# Patient Record
Sex: Female | Born: 1955 | Race: White | Hispanic: No | Marital: Married | Smoking: Former smoker
Health system: Southern US, Community
[De-identification: ages and names within clinical notes are randomized; demographics above are authoritative.]

## PROBLEM LIST (undated history)

## (undated) DIAGNOSIS — K859 Acute pancreatitis without necrosis or infection, unspecified: Secondary | ICD-10-CM

## (undated) DIAGNOSIS — E785 Hyperlipidemia, unspecified: Secondary | ICD-10-CM

## (undated) DIAGNOSIS — Z8719 Personal history of other diseases of the digestive system: Secondary | ICD-10-CM

## (undated) DIAGNOSIS — E039 Hypothyroidism, unspecified: Secondary | ICD-10-CM

## (undated) DIAGNOSIS — K219 Gastro-esophageal reflux disease without esophagitis: Secondary | ICD-10-CM

## (undated) DIAGNOSIS — E079 Disorder of thyroid, unspecified: Secondary | ICD-10-CM

## (undated) DIAGNOSIS — E0590 Thyrotoxicosis, unspecified without thyrotoxic crisis or storm: Secondary | ICD-10-CM

## (undated) DIAGNOSIS — R17 Unspecified jaundice: Secondary | ICD-10-CM

## (undated) HISTORY — PX: TUBAL LIGATION: SHX77

## (undated) HISTORY — PX: ABDOMINAL HYSTERECTOMY: SHX81

## (undated) HISTORY — DX: Hyperlipidemia, unspecified: E78.5

## (undated) HISTORY — PX: APPENDECTOMY: SHX54

---

## 1999-04-26 ENCOUNTER — Encounter: Admission: RE | Admit: 1999-04-26 | Discharge: 1999-04-26 | Payer: Self-pay | Admitting: Family Medicine

## 1999-04-26 ENCOUNTER — Encounter: Payer: Self-pay | Admitting: Family Medicine

## 1999-11-15 ENCOUNTER — Encounter: Payer: Self-pay | Admitting: Family Medicine

## 1999-11-15 ENCOUNTER — Encounter: Admission: RE | Admit: 1999-11-15 | Discharge: 1999-11-15 | Payer: Self-pay | Admitting: Family Medicine

## 1999-12-10 ENCOUNTER — Encounter: Payer: Self-pay | Admitting: Family Medicine

## 1999-12-10 ENCOUNTER — Encounter: Admission: RE | Admit: 1999-12-10 | Discharge: 1999-12-10 | Payer: Self-pay | Admitting: Family Medicine

## 1999-12-26 ENCOUNTER — Encounter: Admission: RE | Admit: 1999-12-26 | Discharge: 1999-12-26 | Payer: Self-pay | Admitting: Family Medicine

## 1999-12-26 ENCOUNTER — Encounter: Payer: Self-pay | Admitting: Family Medicine

## 2000-05-13 ENCOUNTER — Other Ambulatory Visit: Admission: RE | Admit: 2000-05-13 | Discharge: 2000-05-13 | Payer: Self-pay | Admitting: Obstetrics and Gynecology

## 2000-05-19 ENCOUNTER — Ambulatory Visit (HOSPITAL_COMMUNITY): Admission: RE | Admit: 2000-05-19 | Discharge: 2000-05-19 | Payer: Self-pay | Admitting: Gastroenterology

## 2000-05-19 ENCOUNTER — Encounter (INDEPENDENT_AMBULATORY_CARE_PROVIDER_SITE_OTHER): Payer: Self-pay | Admitting: Specialist

## 2000-07-21 ENCOUNTER — Encounter: Payer: Self-pay | Admitting: Gastroenterology

## 2000-07-21 ENCOUNTER — Ambulatory Visit (HOSPITAL_COMMUNITY): Admission: RE | Admit: 2000-07-21 | Discharge: 2000-07-21 | Payer: Self-pay | Admitting: Gastroenterology

## 2000-07-24 ENCOUNTER — Encounter: Admission: RE | Admit: 2000-07-24 | Discharge: 2000-07-24 | Payer: Self-pay | Admitting: Gastroenterology

## 2000-07-24 ENCOUNTER — Encounter: Payer: Self-pay | Admitting: Gastroenterology

## 2000-08-31 ENCOUNTER — Encounter: Admission: RE | Admit: 2000-08-31 | Discharge: 2000-08-31 | Payer: Self-pay | Admitting: Internal Medicine

## 2000-08-31 ENCOUNTER — Encounter: Admission: RE | Admit: 2000-08-31 | Discharge: 2000-08-31 | Payer: Self-pay | Admitting: Surgery

## 2000-08-31 ENCOUNTER — Encounter: Payer: Self-pay | Admitting: Internal Medicine

## 2000-08-31 ENCOUNTER — Encounter: Payer: Self-pay | Admitting: Surgery

## 2000-09-23 ENCOUNTER — Encounter: Payer: Self-pay | Admitting: Surgery

## 2000-09-23 ENCOUNTER — Encounter: Admission: RE | Admit: 2000-09-23 | Discharge: 2000-09-23 | Payer: Self-pay | Admitting: Surgery

## 2000-10-23 ENCOUNTER — Encounter: Admission: RE | Admit: 2000-10-23 | Discharge: 2000-10-23 | Payer: Self-pay | Admitting: Surgery

## 2000-10-23 ENCOUNTER — Encounter: Payer: Self-pay | Admitting: Surgery

## 2001-08-30 ENCOUNTER — Encounter: Payer: Self-pay | Admitting: Family Medicine

## 2001-08-30 ENCOUNTER — Encounter: Admission: RE | Admit: 2001-08-30 | Discharge: 2001-08-30 | Payer: Self-pay | Admitting: Family Medicine

## 2001-10-19 ENCOUNTER — Encounter: Payer: Self-pay | Admitting: Emergency Medicine

## 2001-10-19 ENCOUNTER — Emergency Department (HOSPITAL_COMMUNITY): Admission: EM | Admit: 2001-10-19 | Discharge: 2001-10-19 | Payer: Self-pay | Admitting: Emergency Medicine

## 2004-06-28 ENCOUNTER — Emergency Department (HOSPITAL_COMMUNITY): Admission: EM | Admit: 2004-06-28 | Discharge: 2004-06-29 | Payer: Self-pay | Admitting: Emergency Medicine

## 2005-04-15 ENCOUNTER — Ambulatory Visit (HOSPITAL_COMMUNITY): Admission: RE | Admit: 2005-04-15 | Discharge: 2005-04-15 | Payer: Self-pay | Admitting: Sports Medicine

## 2008-05-06 ENCOUNTER — Emergency Department (HOSPITAL_COMMUNITY): Admission: EM | Admit: 2008-05-06 | Discharge: 2008-05-07 | Payer: Self-pay | Admitting: Emergency Medicine

## 2010-07-02 LAB — LIPASE, BLOOD: Lipase: 38 U/L (ref 11–59)

## 2010-07-02 LAB — COMPREHENSIVE METABOLIC PANEL
ALT: 42 U/L — ABNORMAL HIGH (ref 0–35)
AST: 35 U/L (ref 0–37)
Albumin: 3.9 g/dL (ref 3.5–5.2)
Alkaline Phosphatase: 62 U/L (ref 39–117)
CO2: 23 mEq/L (ref 19–32)
Chloride: 103 mEq/L (ref 96–112)
Creatinine, Ser: 0.7 mg/dL (ref 0.4–1.2)
GFR calc Af Amer: >60 mL/min (ref >60)
GFR calc non Af Amer: >60 mL/min (ref >60)
Potassium: 3.8 mEq/L (ref 3.5–5.1)
Sodium: 136 mEq/L (ref 135–145)
Total Bilirubin: 0.7 mg/dL (ref 0.3–1.2)

## 2010-07-02 LAB — DIFFERENTIAL
Basophils Absolute: 0.1 10*3/uL (ref 0.0–0.1)
Basophils Relative: 1 % (ref 0–1)
Eosinophils Absolute: 0.1 10*3/uL (ref 0.0–0.7)
Eosinophils Relative: 1 % (ref 0–5)
Monocytes Absolute: 0.4 10*3/uL (ref 0.1–1.0)

## 2010-07-02 LAB — CBC
MCV: 93.7 fL (ref 78.0–100.0)
Platelets: 206 10*3/uL (ref 150–400)
RBC: 4.14 MIL/uL (ref 3.87–5.11)
WBC: 6.9 10*3/uL (ref 4.0–10.5)

## 2010-07-02 LAB — HEMOCCULT GUIAC POC 1CARD (OFFICE): Fecal Occult Bld: NEGATIVE

## 2010-08-02 NOTE — Procedures (Signed)
[place]  Patient:    Sheryl Turner, Sheryl Turner                        MRN: [id number] Proc. Date: 05/19/00 Attending:  Petra Kuba, M.D. CC:         Desma Maxim, M.D.   Procedure Report  PROCEDURE:  Esophagogastroduodenoscopy with biopsy.  INDICATIONS:  Atypical chest pain with some help from Protonix and Librax. Consent was signed after risks, benefits, methods and options were thoroughly discussed in the office.  MEDICATIONS:  Demerol 50, Versed 5.  PROCEDURE:  The video endoscope was inserted by direct vision.  The esophagus was normal, except for a small hiatal hernia.  No obvious signs of Barretts or significant esophagitis was seen.  Scope was inserted into the stomach and advanced to the antrum, where some minimal antritis was seen.  Advanced through a normal pylorus into a normal duodenal bulb, and around the C-loop to a normal second portion of the duodenum.  Scope was withdrawn back to the bulb and a good look there ruled out abnormalities in that location.  Scope was withdrawn back to stomach and retroflexed.  High in the cardia, the hiatal hernia was confirmed.  The fundus, angularis, lesser and greater curves were normal on retroflexed visualization.  Straight visualization of the stomach was normal as well.  Scope was then slowly withdrawn back to 20 cm, which again confirmed the normal esophagus.  The scope was advanced to the antrum one more time.  Two biopsies of the antrum and two of the proximal stomach were obtained to rule out Helicobacter.  Air was suctioned.  On slow withdrawal, a few scattered distal esophageal biopsies were obtained to confirm reflux and rule out any other microscopic problems.  The scope was further withdrawn.  Patient tolerated the procedure well, there was no obvious immediate complication.  ENDOSCOPIC DIAGNOSES: 1. Small hiatal hernia without significant reflux or Barretts, status post    distal  esophageal biopsies. 2. Minimal antritis, status biopsy. 3. Otherwise within normal limits esophagogastroduodenoscopy with proximal    stomach biopsies as well to rule out Helicobacter.  PLAN:  Will increase her pump inhibitors to twice a day.  Talk to patient in a week about the pathology.  Call me p.r.n.  Otherwise follow up in six weeks to recheck symptoms and decide any other work-up and plans. DD:  05/19/00 TD:  05/19/00 Job: [id number]

## 2010-09-10 ENCOUNTER — Emergency Department (HOSPITAL_COMMUNITY): Payer: No Typology Code available for payment source

## 2010-09-10 ENCOUNTER — Emergency Department (HOSPITAL_COMMUNITY)
Admission: EM | Admit: 2010-09-10 | Discharge: 2010-09-10 | Disposition: A | Discharge status: Home / Self Care | Payer: No Typology Code available for payment source | Attending: Emergency Medicine | Admitting: Emergency Medicine

## 2010-09-10 DIAGNOSIS — Y9229 Other specified public building as the place of occurrence of the external cause: Secondary | ICD-10-CM | POA: Insufficient documentation

## 2010-09-10 DIAGNOSIS — M79609 Pain in unspecified limb: Secondary | ICD-10-CM | POA: Insufficient documentation

## 2010-09-10 DIAGNOSIS — S8010XA Contusion of unspecified lower leg, initial encounter: Secondary | ICD-10-CM | POA: Insufficient documentation

## 2010-09-10 DIAGNOSIS — W208XXA Other cause of strike by thrown, projected or falling object, initial encounter: Secondary | ICD-10-CM | POA: Insufficient documentation

## 2012-09-12 ENCOUNTER — Encounter (HOSPITAL_COMMUNITY): Payer: Self-pay | Admitting: Emergency Medicine

## 2012-09-12 ENCOUNTER — Emergency Department (HOSPITAL_COMMUNITY): Payer: Self-pay

## 2012-09-12 ENCOUNTER — Emergency Department (HOSPITAL_COMMUNITY)
Admission: EM | Admit: 2012-09-12 | Discharge: 2012-09-12 | Disposition: A | Discharge status: Home / Self Care | Payer: Self-pay | Attending: Emergency Medicine | Admitting: Emergency Medicine

## 2012-09-12 DIAGNOSIS — K859 Acute pancreatitis without necrosis or infection, unspecified: Secondary | ICD-10-CM | POA: Insufficient documentation

## 2012-09-12 DIAGNOSIS — R110 Nausea: Secondary | ICD-10-CM | POA: Insufficient documentation

## 2012-09-12 LAB — COMPREHENSIVE METABOLIC PANEL WITH GFR
ALT: 49 U/L — ABNORMAL HIGH (ref 0–35)
Alkaline Phosphatase: 68 U/L (ref 39–117)
CO2: 24 meq/L (ref 19–32)
GFR calc Af Amer: 79 mL/min — ABNORMAL LOW (ref >90)
Glucose, Bld: 97 mg/dL (ref 70–99)
Potassium: 3.5 meq/L (ref 3.5–5.1)
Sodium: 137 meq/L (ref 135–145)
Total Protein: 7.4 g/dL (ref 6.0–8.3)

## 2012-09-12 LAB — COMPREHENSIVE METABOLIC PANEL
AST: 62 U/L — ABNORMAL HIGH (ref 0–37)
Albumin: 4.2 g/dL (ref 3.5–5.2)
BUN: 16 mg/dL (ref 6–23)
Calcium: 9.4 mg/dL (ref 8.4–10.5)
Chloride: 100 mEq/L (ref 96–112)
Creatinine, Ser: 0.93 mg/dL (ref 0.50–1.10)
GFR calc non Af Amer: 68 mL/min — ABNORMAL LOW (ref >90)
Total Bilirubin: 0.4 mg/dL (ref 0.3–1.2)

## 2012-09-12 LAB — CBC WITH DIFFERENTIAL/PLATELET
Basophils Absolute: 0.1 K/uL (ref 0.0–0.1)
Basophils Relative: 1 % (ref 0–1)
Eosinophils Absolute: 0.1 K/uL (ref 0.0–0.7)
Eosinophils Relative: 1 % (ref 0–5)
HCT: 40.3 % (ref 36.0–46.0)
Hemoglobin: 13.5 g/dL (ref 12.0–15.0)
Lymphocytes Relative: 37 % (ref 12–46)
Lymphs Abs: 2.5 10*3/uL (ref 0.7–4.0)
MCH: 31 pg (ref 26.0–34.0)
MCHC: 33.5 g/dL (ref 30.0–36.0)
MCV: 92.6 fL (ref 78.0–100.0)
Monocytes Absolute: 0.4 K/uL (ref 0.1–1.0)
Monocytes Relative: 6 % (ref 3–12)
Neutro Abs: 3.6 K/uL (ref 1.7–7.7)
Neutrophils Relative %: 55 % (ref 43–77)
Platelets: 234 10*3/uL (ref 150–400)
RBC: 4.35 MIL/uL (ref 3.87–5.11)
RDW: 13.8 % (ref 11.5–15.5)
WBC: 6.7 10*3/uL (ref 4.0–10.5)

## 2012-09-12 LAB — LIPASE, BLOOD: Lipase: 79 U/L — ABNORMAL HIGH (ref 11–59)

## 2012-09-12 LAB — URINALYSIS, ROUTINE W REFLEX MICROSCOPIC
Glucose, UA: NEGATIVE mg/dL
Hgb urine dipstick: NEGATIVE
Leukocytes, UA: NEGATIVE
pH: 7.5 (ref 5.0–8.0)

## 2012-09-12 LAB — POCT I-STAT TROPONIN I: Troponin i, poc: 0 ng/mL (ref 0.00–0.08)

## 2012-09-12 LAB — TROPONIN I: Troponin I: <0.3 ng/mL (ref <0.30)

## 2012-09-12 MED ORDER — ONDANSETRON HCL 4 MG/2ML IJ SOLN
4.0000 mg | Freq: Once | INTRAMUSCULAR | Status: AC
  Administered 2012-09-12: 4 mg via INTRAVENOUS
  Filled 2012-09-12: qty 2

## 2012-09-12 MED ORDER — ONDANSETRON HCL 4 MG PO TABS: 4.0000 mg | ORAL_TABLET | Freq: Four times a day (QID) | ORAL | Status: DC

## 2012-09-12 MED ORDER — SODIUM CHLORIDE 0.9 % IV BOLUS (SEPSIS)
1000.0000 mL | Freq: Once | INTRAVENOUS | Status: AC
  Administered 2012-09-12: 1000 mL via INTRAVENOUS

## 2012-09-12 MED ORDER — MORPHINE SULFATE 4 MG/ML IJ SOLN
4.0000 mg | Freq: Once | INTRAMUSCULAR | Status: AC
  Administered 2012-09-12: 4 mg via INTRAVENOUS
  Filled 2012-09-12: qty 1

## 2012-09-12 MED ORDER — TRAMADOL HCL 50 MG PO TABS: 50.0000 mg | ORAL_TABLET | Freq: Four times a day (QID) | ORAL | Status: DC | PRN

## 2012-09-12 NOTE — ED Provider Notes (Signed)
History    CSN: [id number] Arrival date & time 09/12/12  1607  First MD Initiated Contact with Patient 09/12/12 1625     Chief Complaint  Patient presents with  . Abdominal Pain   (Consider location/radiation/quality/duration/timing/severity/associated sxs/prior Treatment) HPI  57 year old female with no significant past medical history presents complaining of epigastric abdominal pain. Patient reports she was leaving church today and while driving in the car she experiencing an acute onset of sharp achy pain to the epigastric region which radiates to her back. Pain was initially a 9/10 but has been waxing and waning and currently is a 5/10. Nothing seems to make the pain worse but taking deep breath seems to make the pain better.she endorses nausea without vomiting or diarrhea. She tries Tums and drank cola without relief.she tries belching without relief. She denies fever, headache, lightheadedness, dizziness, chest pain, shortness of breath, dysuria, hematuria, hematochezia, melena, or rash.  patient is nonsmoker without any significant cardiac history. no history of alcohol abuse or history of diabetes.  Patient has prior surgical history include cesarean section, appendectomy, and abdominal hysterectomy. last bowel movement was normal. She ate her normal food today, country fried steak.    History reviewed. No pertinent past medical history. Past Surgical History  Procedure Laterality Date  . Cesarean section    . Appendectomy    . Abdominal hysterectomy     No family history on file. History  Substance Use Topics  . Smoking status: Never Smoker   . Smokeless tobacco: Not on file  . Alcohol Use: No   OB History   Grav Para Term Preterm Abortions TAB SAB Ect Mult Living                 Review of Systems  All other systems reviewed and are negative.    Allergies  Darvocet  Home Medications  No current outpatient prescriptions on file. BP 158/77  Pulse 71  Temp(Src)  97.3 F (36.3 C) (Oral)  Resp 18  SpO2 98% Physical Exam  Nursing note and vitals reviewed. Constitutional: She is oriented to person, place, and time. She appears well-developed and well-nourished. No distress.  Awake, alert, nontoxic appearance. Moderately obese.  HENT:  Head: Atraumatic.  Mouth/Throat: Oropharynx is clear and moist.  Eyes: Conjunctivae are normal. Right eye exhibits no discharge. Left eye exhibits no discharge.  Neck: Neck supple.  Cardiovascular: Normal rate, regular rhythm and intact distal pulses.   Pulmonary/Chest: Effort normal. No respiratory distress. She exhibits no tenderness.  Abdominal: Soft. Bowel sounds are normal. There is tenderness (Tenderness to epigastric and left upper quadrant on palpation without guarding or rebound tenderness. Mild tenderness to right upper quadrant with no Murphy sign. No McBurney's point. No hernia noted. No overlying skin changes.). There is no rebound.  Genitourinary:  No CVA tenderness  Musculoskeletal: She exhibits no edema and no tenderness.  ROM appears intact, no obvious focal weakness  Neurological: She is alert and oriented to person, place, and time.  Mental status and motor strength appears intact  Skin: No rash noted.  Psychiatric: She has a normal mood and affect.    ED Course  Procedures (including critical care time)    Date: 09/12/2012  Rate: 63  Rhythm: normal sinus rhythm  QRS Axis: normal  Intervals: normal  ST/T Wave abnormalities: normal  Conduction Disutrbances: none  Narrative Interpretation:   Old EKG Reviewed: No significant changes noted  4:42 PM Patient presents with epigastric and left upper quadrant abdominal pain.  Abdomen is tender but nonsurgical. She is afebrile with stable normal vital signs. Workup initiated.  Will also obtain abd [place] to r/o biliary disease.  I have low suspicion for aortic dissection.  Care discussed with attending.    7:39 PM Patient has elevated lipase at 79.  Mildly elevated liver enzyme with AST 62, ALT 49 normal alkaline phosphatase. She has normal WBC. Her urine is unremarkable.a chest x-ray is unremarkable. The EKG is unremarkable. Will obtain abdominal ultrasound. We'll continue with symptomatically treatment.  9:29 PM Pt with evidence suggestive of mild pancreatitis.  sxs is improving.  Pt felt stable to be discharge.  Recommend clear liquid diet, gradually improve to soft diet and avoid fatty food content.  Return precaution discussed.  Pt agrees to f/u with PCP.    Labs Reviewed  COMPREHENSIVE METABOLIC PANEL - Abnormal; Notable for the following:    AST 62 (*)    ALT 49 (*)    GFR calc non Af Amer 68 (*)    GFR calc Af Amer 79 (*)    All other components within normal limits  LIPASE, BLOOD - Abnormal; Notable for the following:    Lipase 79 (*)    All other components within normal limits  CBC WITH DIFFERENTIAL  URINALYSIS, ROUTINE W REFLEX MICROSCOPIC  TROPONIN I   Dg Chest 2 View  09/12/2012   *RADIOLOGY REPORT*  Clinical Data: Upper abdominal pain.  Shortness of breath.  CHEST - 2 VIEW  Comparison: 06/28/2004.  Findings: The heart, mediastinal and hilar contours are normal. An azygos lobe is incidentally noted.  A small, calcified density described as projecting over the anterior  right first rib on the preceding study is again identified and is unchanged.  The lungs are clear.  There are no effusions or pneumothoraces.  There are no acute bony changes.  IMPRESSION: No active disease.   Original Report Authenticated By: Sander Radon, M.D.   US Abdomen Complete  09/12/2012   *RADIOLOGY REPORT*  Clinical Data:  Mid to right upper quadrant abdominal pain for 3 hours, extending into the back.  History of appendectomy and hysterectomy. Mildly elevated lipase levels.  COMPLETE ABDOMINAL ULTRASOUND  Comparison:  Abdominal pelvic CT 06/29/1994.  Findings:  Gallbladder: Well distended without wall thickening, stones or pericholecystic fluid.  Negative sonographic Murphy's sign.  Common bile duct:   The common bile duct is dilated to 13 mm maximally.  The distal duct is not well visualized.  No intraductal calculi are seen.  Liver:  There is increased hepatic echogenicity most consistent with steatosis.  No focal liver lesions are identified.  IVC:  Visualized portions appear unremarkable.  Pancreas:  The pancreatic duct is at the upper limits of normal in caliber, measuring 4 mm.  No focal pancreatic abnormality is seen.  Spleen:  Visualized portions appear unremarkable.  Right Kidney:   The renal cortical thickness and echogenicity are preserved.  There is no hydronephrosis or focal abnormality. Renal length is 11.0 cm.  Left Kidney:   The renal cortical thickness and echogenicity are preserved.  There is no hydronephrosis or focal abnormality. Renal length is 11.0 cm.  Abdominal aorta:  Visualized portions appear unremarkable.  The distal aorta is partly obscured by bowel gas.  IMPRESSION:  1.  Extrahepatic biliary dilatation may be secondary to pancreatitis or non visualized choledocholithiasis. 2.  Increased hepatic echogenicity, most consistent with steatosis. 3.  No evidence of cholelithiasis or cholecystitis.   Original Report Authenticated By: Carey Bullocks, M.D.  1. Pancreatitis     MDM  BP 134/69  Pulse 59  Temp(Src) 97.3 F (36.3 C) (Oral)  Resp 25  SpO2 99%  I have reviewed nursing notes and vital signs. I personally reviewed the imaging tests through PACS system  I reviewed available ER/hospitalization records thought the EMR   Fayrene Helper, [place] 09/12/12 2130

## 2012-09-12 NOTE — ED Notes (Signed)
Pt in US at this time 

## 2012-09-12 NOTE — ED Notes (Signed)
Pt comfortable with d/c and f/u instructions. Prescriptions x2. 

## 2012-09-12 NOTE — ED Notes (Signed)
Pt returned from US

## 2012-09-12 NOTE — ED Provider Notes (Signed)
Medical screening examination/treatment/procedure(s) were performed by non-physician practitioner and as supervising physician I was immediately available for consultation/collaboration.   Gavin Pound. Tysen Roesler, MD 09/12/12 2135

## 2012-09-12 NOTE — ED Notes (Signed)
Pt reports epigastric to mid abdomen pain that radiates to mid back onset today. Pt reports tried tums and drank cola without relief. Pt reports belching but no relief. Pt denies change in diet.Pt reports sob and nausea.

## 2012-09-13 ENCOUNTER — Inpatient Hospital Stay (HOSPITAL_COMMUNITY)
Admission: EM | Admit: 2012-09-13 | Discharge: 2012-09-19 | DRG: 445 | Disposition: A | Discharge status: Home / Self Care | Payer: MEDICAID | Attending: Internal Medicine | Admitting: Internal Medicine

## 2012-09-13 ENCOUNTER — Encounter (HOSPITAL_COMMUNITY): Payer: Self-pay | Admitting: Emergency Medicine

## 2012-09-13 DIAGNOSIS — D696 Thrombocytopenia, unspecified: Secondary | ICD-10-CM | POA: Diagnosis present

## 2012-09-13 DIAGNOSIS — E876 Hypokalemia: Secondary | ICD-10-CM | POA: Diagnosis present

## 2012-09-13 DIAGNOSIS — Z87891 Personal history of nicotine dependence: Secondary | ICD-10-CM

## 2012-09-13 DIAGNOSIS — K219 Gastro-esophageal reflux disease without esophagitis: Secondary | ICD-10-CM | POA: Diagnosis present

## 2012-09-13 DIAGNOSIS — K859 Acute pancreatitis without necrosis or infection, unspecified: Secondary | ICD-10-CM

## 2012-09-13 DIAGNOSIS — Q441 Other congenital malformations of gallbladder: Secondary | ICD-10-CM

## 2012-09-13 DIAGNOSIS — R1013 Epigastric pain: Secondary | ICD-10-CM | POA: Diagnosis present

## 2012-09-13 DIAGNOSIS — E039 Hypothyroidism, unspecified: Secondary | ICD-10-CM | POA: Diagnosis present

## 2012-09-13 DIAGNOSIS — Q4479 Other congenital malformations of liver: Secondary | ICD-10-CM

## 2012-09-13 DIAGNOSIS — K7689 Other specified diseases of liver: Secondary | ICD-10-CM | POA: Diagnosis present

## 2012-09-13 DIAGNOSIS — K838 Other specified diseases of biliary tract: Secondary | ICD-10-CM | POA: Diagnosis present

## 2012-09-13 DIAGNOSIS — E079 Disorder of thyroid, unspecified: Secondary | ICD-10-CM

## 2012-09-13 DIAGNOSIS — F411 Generalized anxiety disorder: Secondary | ICD-10-CM | POA: Diagnosis present

## 2012-09-13 DIAGNOSIS — K828 Other specified diseases of gallbladder: Secondary | ICD-10-CM | POA: Diagnosis present

## 2012-09-13 DIAGNOSIS — K831 Obstruction of bile duct: Secondary | ICD-10-CM

## 2012-09-13 DIAGNOSIS — K862 Cyst of pancreas: Secondary | ICD-10-CM | POA: Diagnosis present

## 2012-09-13 HISTORY — DX: Personal history of other diseases of the digestive system: Z87.19

## 2012-09-13 HISTORY — DX: Acute pancreatitis without necrosis or infection, unspecified: K85.90

## 2012-09-13 HISTORY — DX: Gastro-esophageal reflux disease without esophagitis: K21.9

## 2012-09-13 HISTORY — DX: Hypothyroidism, unspecified: E03.9

## 2012-09-13 HISTORY — DX: Disorder of thyroid, unspecified: E07.9

## 2012-09-13 LAB — CBC WITH DIFFERENTIAL/PLATELET
Eosinophils Absolute: 0 10*3/uL (ref 0.0–0.7)
Eosinophils Relative: 0 % (ref 0–5)
Hemoglobin: 13.7 g/dL (ref 12.0–15.0)
Lymphocytes Relative: 5 % — ABNORMAL LOW (ref 12–46)
Lymphs Abs: 0.5 10*3/uL — ABNORMAL LOW (ref 0.7–4.0)
MCH: 30.8 pg (ref 26.0–34.0)
MCV: 91.9 fL (ref 78.0–100.0)
Monocytes Relative: 2 % — ABNORMAL LOW (ref 3–12)
Neutrophils Relative %: 93 % — ABNORMAL HIGH (ref 43–77)
RBC: 4.45 MIL/uL (ref 3.87–5.11)
WBC: 10 10*3/uL (ref 4.0–10.5)

## 2012-09-13 LAB — COMPREHENSIVE METABOLIC PANEL
AST: 839 U/L — ABNORMAL HIGH (ref 0–37)
Albumin: 4.1 g/dL (ref 3.5–5.2)
Alkaline Phosphatase: 95 U/L (ref 39–117)
CO2: 21 mEq/L (ref 19–32)
Chloride: 98 mEq/L (ref 96–112)
Creatinine, Ser: 0.73 mg/dL (ref 0.50–1.10)
GFR calc non Af Amer: >90 mL/min (ref >90)
Potassium: 3.8 mEq/L (ref 3.5–5.1)
Total Bilirubin: 2.1 mg/dL — ABNORMAL HIGH (ref 0.3–1.2)

## 2012-09-13 LAB — LIPASE, BLOOD: Lipase: 37 U/L (ref 11–59)

## 2012-09-13 LAB — POCT I-STAT TROPONIN I: Troponin i, poc: 0 ng/mL (ref 0.00–0.08)

## 2012-09-13 MED ORDER — HYDROMORPHONE HCL PF 1 MG/ML IJ SOLN
1.0000 mg | INTRAMUSCULAR | Status: DC | PRN
  Administered 2012-09-13 – 2012-09-19 (×22): 1 mg via INTRAVENOUS
  Filled 2012-09-13 (×22): qty 1

## 2012-09-13 MED ORDER — PANTOPRAZOLE SODIUM 40 MG IV SOLR
40.0000 mg | INTRAVENOUS | Status: DC
  Administered 2012-09-13 – 2012-09-18 (×5): 40 mg via INTRAVENOUS
  Filled 2012-09-13 (×7): qty 40

## 2012-09-13 MED ORDER — SODIUM CHLORIDE 0.9 % IV SOLN
INTRAVENOUS | Status: AC
  Administered 2012-09-13: 1000 mL via INTRAVENOUS

## 2012-09-13 MED ORDER — SODIUM CHLORIDE 0.9 % IV BOLUS (SEPSIS)
1000.0000 mL | Freq: Once | INTRAVENOUS | Status: AC
  Administered 2012-09-13: 1000 mL via INTRAVENOUS

## 2012-09-13 MED ORDER — HYDROMORPHONE HCL PF 1 MG/ML IJ SOLN
1.0000 mg | Freq: Once | INTRAMUSCULAR | Status: AC
  Administered 2012-09-13: 1 mg via INTRAVENOUS
  Filled 2012-09-13: qty 1

## 2012-09-13 MED ORDER — LEVOTHYROXINE SODIUM 100 MCG IV SOLR
50.0000 ug | Freq: Every day | INTRAVENOUS | Status: DC
  Filled 2012-09-13: qty 5

## 2012-09-13 MED ORDER — LEVOTHYROXINE SODIUM 100 MCG IV SOLR
50.0000 ug | Freq: Every day | INTRAVENOUS | Status: DC
  Administered 2012-09-13 – 2012-09-18 (×6): 50 ug via INTRAVENOUS
  Filled 2012-09-13 (×8): qty 5

## 2012-09-13 MED ORDER — HEPARIN SODIUM (PORCINE) 5000 UNIT/ML IJ SOLN
5000.0000 [IU] | Freq: Three times a day (TID) | INTRAMUSCULAR | Status: DC
  Administered 2012-09-13 – 2012-09-14 (×3): 5000 [IU] via SUBCUTANEOUS
  Filled 2012-09-13 (×6): qty 1

## 2012-09-13 MED ORDER — CIPROFLOXACIN IN D5W 400 MG/200ML IV SOLN
400.0000 mg | Freq: Two times a day (BID) | INTRAVENOUS | Status: DC
  Administered 2012-09-13 – 2012-09-19 (×12): 400 mg via INTRAVENOUS
  Filled 2012-09-13 (×14): qty 200

## 2012-09-13 MED ORDER — ONDANSETRON 4 MG PO TBDP
4.0000 mg | ORAL_TABLET | Freq: Once | ORAL | Status: DC
  Filled 2012-09-13: qty 1

## 2012-09-13 MED ORDER — LORAZEPAM 2 MG/ML IJ SOLN: 0.5000 mg | Freq: Once | INTRAMUSCULAR | Status: DC

## 2012-09-13 MED ORDER — SODIUM CHLORIDE 0.9 % IJ SOLN
3.0000 mL | Freq: Two times a day (BID) | INTRAMUSCULAR | Status: DC
  Administered 2012-09-14 – 2012-09-19 (×6): 3 mL via INTRAVENOUS

## 2012-09-13 MED ORDER — SODIUM CHLORIDE 0.9 % IV SOLN
INTRAVENOUS | Status: DC
  Administered 2012-09-13 – 2012-09-14 (×2): via INTRAVENOUS

## 2012-09-13 MED ORDER — ONDANSETRON HCL 4 MG/2ML IJ SOLN
4.0000 mg | Freq: Four times a day (QID) | INTRAMUSCULAR | Status: DC | PRN
  Administered 2012-09-13 – 2012-09-19 (×10): 4 mg via INTRAVENOUS
  Filled 2012-09-13 (×11): qty 2

## 2012-09-13 MED ORDER — ONDANSETRON 4 MG PO TBDP
ORAL_TABLET | ORAL | Status: AC
  Filled 2012-09-13: qty 1

## 2012-09-13 MED ORDER — ONDANSETRON HCL 4 MG PO TABS: 4.0000 mg | ORAL_TABLET | Freq: Four times a day (QID) | ORAL | Status: DC | PRN

## 2012-09-13 MED ORDER — IBUPROFEN 400 MG PO TABS
400.0000 mg | ORAL_TABLET | Freq: Four times a day (QID) | ORAL | Status: DC | PRN
  Administered 2012-09-15: 400 mg via ORAL
  Filled 2012-09-13 (×2): qty 1

## 2012-09-13 NOTE — Progress Notes (Signed)
Patient transfer from ED at 1715.   Patient is alert and orient x4, denies pain during admission assessment.  No skin issue noted.  Oriented patient to unit and staffs.  Side rails up x3, call bell within reach.

## 2012-09-13 NOTE — ED Notes (Signed)
Pt given zofran ODT; now reporting chest pain; EKG performed.

## 2012-09-13 NOTE — ED Provider Notes (Signed)
History    CSN: [id number] Arrival date & time 09/13/12  1122  First MD Initiated Contact with Patient 09/13/12 1424     Chief Complaint  Patient presents with  . Pancreatitis   (Consider location/radiation/quality/duration/timing/severity/associated sxs/prior Treatment) HPI Pt with no significant PMH seen yesterday for epigastric pain had mildly elevated LFT and Lipase then and US showing biliary ductal dilatation but no definite stone seen in gall bladder or distal duct. She was feeling better last night and discharged with pain medication and clear liquid diet but pain returned today, has been severe, No particular provoking or relieving factors. No fever, no vomiting today.  Past Medical History  Diagnosis Date  . Pancreatitis   . Thyroid disease    Past Surgical History  Procedure Laterality Date  . Cesarean section    . Appendectomy    . Abdominal hysterectomy     No family history on file. History  Substance Use Topics  . Smoking status: Never Smoker   . Smokeless tobacco: Not on file  . Alcohol Use: No   OB History   Grav Para Term Preterm Abortions TAB SAB Ect Mult Living                 Review of Systems All other systems reviewed and are negative except as noted in HPI.   Allergies  Darvocet  Home Medications   Current Outpatient Rx  Name  Route  Sig  Dispense  Refill  . levothyroxine (SYNTHROID, LEVOTHROID) 100 MCG tablet   Oral   Take 100 mcg by mouth daily before breakfast.         . ondansetron (ZOFRAN) 4 MG tablet   Oral   Take 1 tablet (4 mg total) by mouth every 6 (six) hours.   12 tablet   0   . traMADol (ULTRAM) 50 MG tablet   Oral   Take 50 mg by mouth every 6 (six) hours as needed for pain.          BP 130/77  Pulse 66  Temp(Src) 98.4 F (36.9 C) (Oral)  Resp 18  SpO2 97% Physical Exam  Nursing note and vitals reviewed. Constitutional: She is oriented to person, place, and time. She appears well-developed and  well-nourished.  HENT:  Head: Normocephalic and atraumatic.  Eyes: EOM are normal. Pupils are equal, round, and reactive to light.  Neck: Normal range of motion. Neck supple.  Cardiovascular: Normal rate, normal heart sounds and intact distal pulses.   Pulmonary/Chest: Effort normal and breath sounds normal.  Abdominal: Bowel sounds are normal. She exhibits no distension. There is tenderness (epigastric, moderate). There is no rebound and no guarding.  Musculoskeletal: Normal range of motion. She exhibits no edema and no tenderness.  Neurological: She is alert and oriented to person, place, and time. She has normal strength. No cranial nerve deficit or sensory deficit.  Skin: Skin is warm and dry. No rash noted.  Psychiatric: She has a normal mood and affect.    ED Course  Procedures (including critical care time) Labs Reviewed  CBC WITH DIFFERENTIAL - Abnormal; Notable for the following:    Neutrophils Relative % 93 (*)    Neutro Abs 9.3 (*)    Lymphocytes Relative 5 (*)    Lymphs Abs 0.5 (*)    Monocytes Relative 2 (*)    All other components within normal limits  LIPASE, BLOOD  COMPREHENSIVE METABOLIC PANEL  POCT I-STAT TROPONIN I   Dg Chest 2 View  09/12/2012   *RADIOLOGY REPORT*  Clinical Data: Upper abdominal pain.  Shortness of breath.  CHEST - 2 VIEW  Comparison: 06/28/2004.  Findings: The heart, mediastinal and hilar contours are normal. An azygos lobe is incidentally noted.  A small, calcified density described as projecting over the anterior  right first rib on the preceding study is again identified and is unchanged.  The lungs are clear.  There are no effusions or pneumothoraces.  There are no acute bony changes.  IMPRESSION: No active disease.   Original Report Authenticated By: Sander Radon, M.D.   US Abdomen Complete  09/12/2012   *RADIOLOGY REPORT*  Clinical Data:  Mid to right upper quadrant abdominal pain for 3 hours, extending into the back.  History of  appendectomy and hysterectomy. Mildly elevated lipase levels.  COMPLETE ABDOMINAL ULTRASOUND  Comparison:  Abdominal pelvic CT 06/29/1994.  Findings:  Gallbladder: Well distended without wall thickening, stones or pericholecystic fluid. Negative sonographic Murphy's sign.  Common bile duct:   The common bile duct is dilated to 13 mm maximally.  The distal duct is not well visualized.  No intraductal calculi are seen.  Liver:  There is increased hepatic echogenicity most consistent with steatosis.  No focal liver lesions are identified.  IVC:  Visualized portions appear unremarkable.  Pancreas:  The pancreatic duct is at the upper limits of normal in caliber, measuring 4 mm.  No focal pancreatic abnormality is seen.  Spleen:  Visualized portions appear unremarkable.  Right Kidney:   The renal cortical thickness and echogenicity are preserved.  There is no hydronephrosis or focal abnormality. Renal length is 11.0 cm.  Left Kidney:   The renal cortical thickness and echogenicity are preserved.  There is no hydronephrosis or focal abnormality. Renal length is 11.0 cm.  Abdominal aorta:  Visualized portions appear unremarkable.  The distal aorta is partly obscured by bowel gas.  IMPRESSION:  1.  Extrahepatic biliary dilatation may be secondary to pancreatitis or non visualized choledocholithiasis. 2.  Increased hepatic echogenicity, most consistent with steatosis. 3.  No evidence of cholelithiasis or cholecystitis.   Original Report Authenticated By: Carey Bullocks, M.D.   1. Pancreatitis due to biliary obstruction     MDM  Labs and images from yesterday reviewed. Lipase done today in triage improved from yesterday. I am concerned about a retained gall duct stone, discussed with Dr. Madilyn Fireman who will consult for possible ERCP, requests admission through Hospitalist. LFTs added for this visit as well. Pain medications and fluids.   Sunshyne Horvath B. Bernette Mayers, MD 09/13/12 1450

## 2012-09-13 NOTE — ED Notes (Signed)
Pt diagnosed with pancreatitis last night; was given px and was told that if they did not keep pain under control then to come back. Pt visibly uncomfortable and pale.

## 2012-09-13 NOTE — Progress Notes (Signed)
Unit CM UR Completed by [facility] ED CM  W. Howard Bunte RN  

## 2012-09-13 NOTE — H&P (Signed)
[facility] History and Physical  Sheryl Turner ZOX:[id number] DOB: 01-15-1956 DOA: 09/13/2012  Referring physician: Dr. Bernette Mayers PCP: Willow Ora, MD    Chief Complaint: epigastric pain  HPI: Sheryl Turner is a 57 y.o. female with a history of thyroid disorder reports sudden onset of epigastric pain after church yesterday.  She had recently eaten country fried steak and fried okra.  She became very nauseated but did not vomit.  She came to the ED was treated and released.  Last night she had severe gerd symptoms and today her pain is so severe she returned to the ED.  The pain is above her naval and radiates up to just under her breasts.  She is unable to eat.  She has had pain like this in the past occasionally but never this severe.  She takes aleve approx 1ce a week for arthralgias.  She has had multiple abdominal surgeries (appendix, c-section, hysterectomy) but still has her gall bladder.  She's had no other recent illness.  She does not consume alcohol or use recreational drugs (never), she has a 20+ pack year smoking history, but quit over 10 years ago.  Her father had gall bladder disease.     Review of Systems:   Reports mild anorexia over the past week without weight loss.  Denies changes in bowel habits, dysuria, vomiting, weight loss, chest pain, palpitations, SOB, head ache or dizziness.   Past Medical History  Diagnosis Date  . Pancreatitis   . Thyroid disease    Past Surgical History  Procedure Laterality Date  . Cesarean section    . Appendectomy    . Abdominal hysterectomy     Social History:  reports that she has never smoked. She does not have any smokeless tobacco history on file. She reports that she does not drink alcohol or use illicit drugs.   +Previous smoker.  Married 37 years.   Allergies  Allergen Reactions  . Darvocet (Propoxyphene-Acetaminophen)     Family History: Mother passed with a brain aneurysm and some type of GI illness Father had gall  bladder disease that made him very ill, but is alive at 24 yo. Her children are in good health.   Prior to Admission medications   Medication Sig Start Date End Date Taking? Authorizing Provider  levothyroxine (SYNTHROID, LEVOTHROID) 100 MCG tablet Take 100 mcg by mouth daily before breakfast.   Yes Historical Provider, MD  ondansetron (ZOFRAN) 4 MG tablet Take 1 tablet (4 mg total) by mouth every 6 (six) hours. 09/12/12  Yes Fayrene Helper, PA-C  traMADol (ULTRAM) 50 MG tablet Take 50 mg by mouth every 6 (six) hours as needed for pain. 09/12/12  Yes Fayrene Helper, PA-C   Physical Exam: Filed Vitals:   09/13/12 1137 09/13/12 1453 09/13/12 1455  BP: 130/77 152/103   Pulse: 66  124  Temp: 98.4 F (36.9 C)    TempSrc: Oral    Resp: 18    SpO2: 97%  98%     General:  Awake and alert, shivering, under several blankets.  Eyes: sclera clear, pupils equil and round  ENT: oropharynx without exudate or erythema  Neck: supple, no lymphadenopathy  Cardiovascular: Tachy cardic, no m/r/g, no LLE  Respiratory: cta no w/c/r, no accessory muscle use.  Abdomen: soft, minimally tender just right of the naval, no masses, + bs  Skin: no jaundice, No bruising, rash or lesions.    Musculoskeletal: 5/5 strength in each extremity.  Psychiatric: A&O, Cooperative, slightly anxious, Appropriate  Neurologic: CN 2-12 grossly in tact, non-focal  Labs on Admission:  Basic Metabolic Panel:  Recent Labs Lab 09/12/12 1630 09/13/12 1146  NA 137 135  K 3.5 3.8  CL 100 98  CO2 24 21  GLUCOSE 97 136*  BUN 16 11  CREATININE 0.93 0.73  CALCIUM 9.4 9.2   Liver Function Tests:  Recent Labs Lab 09/12/12 1630 09/13/12 1146  AST 62* 839*  ALT 49* 716*  ALKPHOS 68 95  BILITOT 0.4 2.1*  PROT 7.4 7.3  ALBUMIN 4.2 4.1    Recent Labs Lab 09/12/12 1637 09/13/12 1146  LIPASE 79* 37   CBC:  Recent Labs Lab 09/12/12 1630 09/13/12 1146  WBC 6.7 10.0  NEUTROABS 3.6 9.3*  HGB 13.5 13.7  HCT  40.3 40.9  MCV 92.6 91.9  PLT 234 215   Cardiac Enzymes:  Recent Labs Lab 09/12/12 1637  TROPONINI <0.30     Radiological Exams on Admission: Dg Chest 2 View  09/12/2012   *RADIOLOGY REPORT*  Clinical Data: Upper abdominal pain.  Shortness of breath.  CHEST - 2 VIEW  Comparison: 06/28/2004.  Findings: The heart, mediastinal and hilar contours are normal. An azygos lobe is incidentally noted.  A small, calcified density described as projecting over the anterior  right first rib on the preceding study is again identified and is unchanged.  The lungs are clear.  There are no effusions or pneumothoraces.  There are no acute bony changes.  IMPRESSION: No active disease.   Original Report Authenticated By: Sander Radon, M.D.   US Abdomen Complete  09/12/2012   *RADIOLOGY REPORT*  Clinical Data:  Mid to right upper quadrant abdominal pain for 3 hours, extending into the back.  History of appendectomy and hysterectomy. Mildly elevated lipase levels.  COMPLETE ABDOMINAL ULTRASOUND  Comparison:  Abdominal pelvic CT 06/29/1994.  Findings:  Gallbladder: Well distended without wall thickening, stones or pericholecystic fluid. Negative sonographic Murphy's sign.  Common bile duct:   The common bile duct is dilated to 13 mm maximally.  The distal duct is not well visualized.  No intraductal calculi are seen.  Liver:  There is increased hepatic echogenicity most consistent with steatosis.  No focal liver lesions are identified.  IVC:  Visualized portions appear unremarkable.  Pancreas:  The pancreatic duct is at the upper limits of normal in caliber, measuring 4 mm.  No focal pancreatic abnormality is seen.  Spleen:  Visualized portions appear unremarkable.  Right Kidney:   The renal cortical thickness and echogenicity are preserved.  There is no hydronephrosis or focal abnormality. Renal length is 11.0 cm.  Left Kidney:   The renal cortical thickness and echogenicity are preserved.  There is no hydronephrosis  or focal abnormality. Renal length is 11.0 cm.  Abdominal aorta:  Visualized portions appear unremarkable.  The distal aorta is partly obscured by bowel gas.  IMPRESSION:  1.  Extrahepatic biliary dilatation may be secondary to pancreatitis or non visualized choledocholithiasis. 2.  Increased hepatic echogenicity, most consistent with steatosis. 3.  No evidence of cholelithiasis or cholecystitis.   Original Report Authenticated By: Carey Bullocks, M.D.    EKG: Independently reviewed. NSR with prolonged q-t interval  Assessment/Plan Principal Problem:   Acute epigastric pain Active Problems:   Dilated cbd, acquired  Acute onset Epigastric pain with nausea Uncertain etiology.  WBC not elevated, Afebrile Dilated CBD on u/s 6/29, but no gall stones. Negative murphy's sign Discussed with Dr. Madilyn Fireman of [facility].  Will order MRCP.  He  will likely see her in the morning 7/1 unless the MRCP has worrisome findings. Biliary dyskinesia vs passing a stone vs acute on chronic pancreatitis. Will treat like pancreatitis:  NPO, ice chips, IV Hydration, pain management, Zofran We appreciate Dr. Madilyn Fireman' consultation.  Hypothyroidism  Continue synthroid  GERD Protonix  DVT Prophylaxis Heparin.  Code Status: full Family Communication: husband at bedside Disposition Plan: inpatient.  Time spent:60  Conley Canal [facility] Pager [phone]  If 7PM-7AM, please contact night-coverage [email or web address] Password [facility] 09/13/2012, 3:56 PM

## 2012-09-14 ENCOUNTER — Encounter (HOSPITAL_COMMUNITY): Payer: Self-pay | Admitting: *Deleted

## 2012-09-14 ENCOUNTER — Inpatient Hospital Stay (HOSPITAL_COMMUNITY): Payer: Self-pay

## 2012-09-14 DIAGNOSIS — K838 Other specified diseases of biliary tract: Secondary | ICD-10-CM

## 2012-09-14 DIAGNOSIS — K859 Acute pancreatitis without necrosis or infection, unspecified: Secondary | ICD-10-CM

## 2012-09-14 DIAGNOSIS — R17 Unspecified jaundice: Secondary | ICD-10-CM

## 2012-09-14 DIAGNOSIS — E079 Disorder of thyroid, unspecified: Secondary | ICD-10-CM

## 2012-09-14 DIAGNOSIS — R52 Pain, unspecified: Secondary | ICD-10-CM

## 2012-09-14 DIAGNOSIS — R1013 Epigastric pain: Secondary | ICD-10-CM

## 2012-09-14 HISTORY — DX: Unspecified jaundice: R17

## 2012-09-14 LAB — CBC
HCT: 36.7 % (ref 36.0–46.0)
HCT: 35 % — ABNORMAL LOW (ref 36.0–46.0)
Hemoglobin: 12 g/dL (ref 12.0–15.0)
MCHC: 32.7 g/dL (ref 30.0–36.0)
MCHC: 32.9 g/dL (ref 30.0–36.0)
MCV: 93.9 fL (ref 78.0–100.0)
MCV: 93.1 fL (ref 78.0–100.0)
RDW: 14.4 % (ref 11.5–15.5)

## 2012-09-14 LAB — BASIC METABOLIC PANEL
BUN: 15 mg/dL (ref 6–23)
CO2: 21 mEq/L (ref 19–32)
Chloride: 103 mEq/L (ref 96–112)
Creatinine, Ser: 0.99 mg/dL (ref 0.50–1.10)

## 2012-09-14 LAB — LIPASE, BLOOD: Lipase: 25 U/L (ref 11–59)

## 2012-09-14 LAB — COMPREHENSIVE METABOLIC PANEL
ALT: 648 U/L — ABNORMAL HIGH (ref 0–35)
Albumin: 3.3 g/dL — ABNORMAL LOW (ref 3.5–5.2)
Calcium: 8.1 mg/dL — ABNORMAL LOW (ref 8.4–10.5)
GFR calc Af Amer: 71 mL/min — ABNORMAL LOW (ref >90)
Glucose, Bld: 111 mg/dL — ABNORMAL HIGH (ref 70–99)
Sodium: 138 mEq/L (ref 135–145)
Total Protein: 6.5 g/dL (ref 6.0–8.3)

## 2012-09-14 LAB — HEPATIC FUNCTION PANEL
Bilirubin, Direct: 3.2 mg/dL — ABNORMAL HIGH (ref 0.0–0.3)
Indirect Bilirubin: 0.7 mg/dL (ref 0.3–0.9)
Total Bilirubin: 3.9 mg/dL — ABNORMAL HIGH (ref 0.3–1.2)

## 2012-09-14 MED ORDER — WHITE PETROLATUM GEL
Status: AC
Start: 2012-09-14 — End: 2012-09-14
  Administered 2012-09-14: 0.2
  Filled 2012-09-14: qty 5

## 2012-09-14 MED ORDER — LORAZEPAM 2 MG/ML IJ SOLN: 0.5000 mg | Freq: Four times a day (QID) | INTRAMUSCULAR | Status: DC | PRN

## 2012-09-14 MED ORDER — GADOBENATE DIMEGLUMINE 529 MG/ML IV SOLN
20.0000 mL | Freq: Once | INTRAVENOUS | Status: AC | PRN
  Administered 2012-09-14: 20 mL via INTRAVENOUS

## 2012-09-14 MED ORDER — ENOXAPARIN SODIUM 30 MG/0.3ML ~~LOC~~ SOLN
40.0000 mg | SUBCUTANEOUS | Status: DC
  Administered 2012-09-14 – 2012-09-15 (×2): 40 mg via SUBCUTANEOUS
  Filled 2012-09-14 (×3): qty 0.4

## 2012-09-14 MED ORDER — SODIUM CHLORIDE 0.9 % IV SOLN
INTRAVENOUS | Status: DC
  Administered 2012-09-14 – 2012-09-19 (×11): via INTRAVENOUS
  Filled 2012-09-14 (×20): qty 1000

## 2012-09-14 NOTE — H&P (Signed)
Addendum  Patient seen and examined, chart and data base reviewed.  I agree with the above assessment and plan.  For full details please see Mrs. Algis Downs PA note.  Transaminainits and abdominal pain. MRCP and GI consult.   Clint Lipps, MD [facility] Pager: [phone] 09/14/2012, 7:11 AM

## 2012-09-14 NOTE — Progress Notes (Signed)
[facility] PROGRESS NOTE  Sheryl Turner ZOX:[id number] DOB: Oct 02, 1955 DOA: 09/13/2012 PCP: Willow Ora, MD  Assessment/Plan:  Acute onset Epigastric pain with nausea   Dilated CBD on u/s 6/29, but no gall stones. Negative murphy's sign   MRCP 7/1 shows dilation of the CBD with abrupt tapering of the distal cbd at the ampulla.  Considerations are distal bile duct stricture, versus ampullary tumor  [facility] considering ERCP / EUS.  Will treat like pancreatitis: NPO, ice chips, IV Hydration, pain meds, Zofran   We appreciate [facility]'s consultation.   transaminases trending down.  Acute Hep panel pending.  Pancreatic pseudocyst  8x5 mm cyst on uncinate process of pancreas.  Possible pseudocyst  Patient denies history of pancreatitis.  Hypothyroidism   Continue synthroid   GERD   Protonix  Mild Hypokalemia  Replete in IVF.  Anxiety  Low dose prn ativan IV   DVT Prophylaxis:  lovenox  Code Status: full Family Communication: Disposition Plan: Inpatient.  D/C to home when medically appropriate.   Consultants:  [facility]  Procedures:    Antibiotics:  Cipro  HPI/Subjective: Patient was freezing in the ED yesterday with 4 blankets, today she is hot with no covers and cool wet rags on her head and legs.  She reports the pain shoots straight thru to her back.  No diarrhea or vomiting.  The pain will no allow her to relax and take a deep breath.  She is concerned that her urine is dark.  Objective: Filed Vitals:   09/13/12 1700 09/13/12 2143 09/14/12 0455 09/14/12 1107  BP: 118/72 100/52 97/60   Pulse: 106 103 85   Temp: 99.5 F (37.5 C) 98.7 F (37.1 C) 98.5 F (36.9 C) 98.6 F (37 C)  TempSrc: Oral Oral Oral Oral  Resp: 18 16 16    Height: 5\' 4"  (1.626 m)     Weight: 95.936 kg (211 lb 8 oz)     SpO2: 93% 94% 96%     Intake/Output Summary (Last 24 hours) at 09/14/12 1210 Last data filed at 09/14/12 0600  Gross per 24 hour  Intake  1202.5 ml  Output    500 ml  Net  702.5 ml   Filed Weights   09/13/12 1700  Weight: 95.936 kg (211 lb 8 oz)    Exam:   General:  A&O, slightly anxious, lying in bed. No jaundice or icterus.  Cardiovascular: RRR, no murmurs, rubs or gallops, no lower extremity edema  Respiratory: CTA, no wheeze, crackles, or rales.  No increased work of breathing.  Abdomen: Soft, non-tender, non-distended, + bowel sounds, no masses  Musculoskeletal: Able to move all 4 extremities, 5/5 strength in each  Data Reviewed: Basic Metabolic Panel:  Recent Labs Lab 09/12/12 1630 09/13/12 1146 09/14/12 0631 09/14/12 0845  NA 137 135 138 136  K 3.5 3.8 3.7 3.3*  CL 100 98 104 103  CO2 24 21 23 21   GLUCOSE 97 136* 111* 87  BUN 16 11 14 15   CREATININE 0.93 0.73 1.01 0.99  CALCIUM 9.4 9.2 8.1* 8.0*   Liver Function Tests:  Recent Labs Lab 09/12/12 1630 09/13/12 1146 09/14/12 0631 09/14/12 0845  AST 62* 839* 516* 453*  ALT 49* 716* 648* 593*  ALKPHOS 68 95 95 113  BILITOT 0.4 2.1* 3.5* 3.9*  PROT 7.4 7.3 6.5 6.1  ALBUMIN 4.2 4.1 3.3* 3.2*    Recent Labs Lab 09/12/12 1637 09/13/12 1146  LIPASE 79* 37   No results found for this basename:  AMMONIA,  in the last 168 hours CBC:  Recent Labs Lab 09/12/12 1630 09/13/12 1146 09/14/12 0631 09/14/12 0845  WBC 6.7 10.0 10.8* 3.5*  NEUTROABS 3.6 9.3*  --   --   HGB 13.5 13.7 12.0 11.5*  HCT 40.3 40.9 36.7 35.0*  MCV 92.6 91.9 93.9 93.1  PLT 234 215 176 144*   Cardiac Enzymes:  Recent Labs Lab 09/12/12 1637  TROPONINI <0.30   BNP (last 3 results) No results found for this basename: PROBNP,  in the last 8760 hours CBG: No results found for this basename: GLUCAP,  in the last 168 hours  No results found for this or any previous visit (from the past 240 hour(s)).   Studies: Dg Chest 2 View  09/12/2012   *RADIOLOGY REPORT*  Clinical Data: Upper abdominal pain.  Shortness of breath.  CHEST - 2 VIEW  Comparison: 06/28/2004.   Findings: The heart, mediastinal and hilar contours are normal. An azygos lobe is incidentally noted.  A small, calcified density described as projecting over the anterior  right first rib on the preceding study is again identified and is unchanged.  The lungs are clear.  There are no effusions or pneumothoraces.  There are no acute bony changes.  IMPRESSION: No active disease.   Original Report Authenticated By: Sander Radon, M.D.   US Abdomen Complete  09/12/2012   *RADIOLOGY REPORT*  Clinical Data:  Mid to right upper quadrant abdominal pain for 3 hours, extending into the back.  History of appendectomy and hysterectomy. Mildly elevated lipase levels.  COMPLETE ABDOMINAL ULTRASOUND  Comparison:  Abdominal pelvic CT 06/29/1994.  Findings:  Gallbladder: Well distended without wall thickening, stones or pericholecystic fluid. Negative sonographic Murphy's sign.  Common bile duct:   The common bile duct is dilated to 13 mm maximally.  The distal duct is not well visualized.  No intraductal calculi are seen.  Liver:  There is increased hepatic echogenicity most consistent with steatosis.  No focal liver lesions are identified.  IVC:  Visualized portions appear unremarkable.  Pancreas:  The pancreatic duct is at the upper limits of normal in caliber, measuring 4 mm.  No focal pancreatic abnormality is seen.  Spleen:  Visualized portions appear unremarkable.  Right Kidney:   The renal cortical thickness and echogenicity are preserved.  There is no hydronephrosis or focal abnormality. Renal length is 11.0 cm.  Left Kidney:   The renal cortical thickness and echogenicity are preserved.  There is no hydronephrosis or focal abnormality. Renal length is 11.0 cm.  Abdominal aorta:  Visualized portions appear unremarkable.  The distal aorta is partly obscured by bowel gas.  IMPRESSION:  1.  Extrahepatic biliary dilatation may be secondary to pancreatitis or non visualized choledocholithiasis. 2.  Increased hepatic  echogenicity, most consistent with steatosis. 3.  No evidence of cholelithiasis or cholecystitis.   Original Report Authenticated By: Carey Bullocks, M.D.   Mr 3d Recon At Scanner  09/14/2012   *RADIOLOGY REPORT*  Clinical Data:  Right upper quadrant pain, nausea and vomiting. Dilated common bile duct.  MRI ABDOMEN WITHOUT AND WITH CONTRAST (INCLUDING MRCP)  Technique:  Multiplanar multisequence MR imaging of the abdomen was performed both before and after the administration of intravenous contrast. Heavily T2-weighted images of the biliary and pancreatic ducts were obtained, and three-dimensional MRCP images were rendered by post processing.  Contrast: 20mL MULTIHANCE GADOBENATE DIMEGLUMINE 529 MG/ML IV SOLN  Comparison:  Ultrasound of the abdomen dated 09/12/2012.  Comment:  The examination is suboptimal  related to extensive patient respiratory motion.  Findings:  MRCP images demonstrate mild to moderate intrahepatic biliary ductal dilatation.  The bile ducts do not have a beaded appearance.  The common bile duct is also dilated measuring up to 13 mm in the porta hepatis.  Gallbladder is moderately distended. No gallbladder wall thickening or pericholecystic fluid.  Notably, there are no signal voids within the gallbladder or the common bile duct to suggest the presence of cholelithiasis or choledocholithiasis.  The common bile duct abruptly tapers to a normal caliber distally at the level of the ampulla.  This tapering appears to be rather abrupt, and there is some soft tissue fullness at the level of the ampulla where there appears to be a small amount of soft tissue which projects into the lumen of the duodenum (best demonstrated on images 26 of series 4 and 5).  Post gadolinium images demonstrate some progressive enhancement in this region (best demonstrated on image 74 of series 1804), suspicious for a small ampullary lesion.  This is difficult to discretely measure on today's MR images secondary to motion,  but a suspected to be approximately 1.5 cm in size or less. The dorsal pancreatic duct is normal in caliber, and there appears to be pancreatic divisum anatomy.  This is best demonstrated on images 22 - 24 of series 4, but is poorly evaluated on other pulse sequences secondary to motion. The ventral pancreatic duct is also normal in caliber.  In the region of the uncinate process of the pancreas there is a tiny 8 x 5 mm lesion that is low signal intensity on T1- weighted images, high signal intensity on T2-weighted images, and does not appear to enhance.  No peripancreatic lymphadenopathy is noted.  No focal hepatic lesions are identified.  Diffuse loss of signal intensity throughout the hepatic parenchyma on the out of phase dual echo images, compatible with severe hepatic steatosis. The visualized portions of the spleen, bilateral adrenal glands and bilateral kidneys are unremarkable.  IMPRESSION:  1.  Mild to moderate intrahepatic biliary ductal dilatation, and dilatation of the common bile duct which abruptly tapers at the level of the ampulla.  It is uncertain whether not this is simply related to a distal common bile duct stricture, or is related to an underlying ampullary neoplasm.  Further evaluation with endoscopic ultrasound is recommended at this time. 2.  There appears to be pancreatic divisum anatomy, as discussed above. 3.  No evidence of cholelithiasis or choledocholithiasis on today's examination. 4.  8 x 5 mm cystic lesion in the uncinate process of the pancreas is nonspecific and warrants attention on follow-up studies.  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  A 1-year follow-up MRI of the abdomen with contrast is recommended. 5.  Severe hepatic steatosis.   Original Report Authenticated By: Trudie Reed, M.D.   Mr Abd W/wo Cm/mrcp  09/14/2012   *RADIOLOGY REPORT*  Clinical Data:  Right upper quadrant pain, nausea and  vomiting. Dilated common bile duct.  MRI ABDOMEN WITHOUT AND WITH CONTRAST (INCLUDING MRCP)  Technique:  Multiplanar multisequence MR imaging of the abdomen was performed both before and after the administration of intravenous contrast. Heavily T2-weighted images of the biliary and pancreatic ducts were obtained, and three-dimensional MRCP images were rendered by post processing.  Contrast: 20mL MULTIHANCE GADOBENATE DIMEGLUMINE 529 MG/ML IV SOLN  Comparison:  Ultrasound of the abdomen dated 09/12/2012.  Comment:  The examination is suboptimal related  to extensive patient respiratory motion.  Findings:  MRCP images demonstrate mild to moderate intrahepatic biliary ductal dilatation.  The bile ducts do not have a beaded appearance.  The common bile duct is also dilated measuring up to 13 mm in the porta hepatis.  Gallbladder is moderately distended. No gallbladder wall thickening or pericholecystic fluid.  Notably, there are no signal voids within the gallbladder or the common bile duct to suggest the presence of cholelithiasis or choledocholithiasis.  The common bile duct abruptly tapers to a normal caliber distally at the level of the ampulla.  This tapering appears to be rather abrupt, and there is some soft tissue fullness at the level of the ampulla where there appears to be a small amount of soft tissue which projects into the lumen of the duodenum (best demonstrated on images 26 of series 4 and 5).  Post gadolinium images demonstrate some progressive enhancement in this region (best demonstrated on image 74 of series 1804), suspicious for a small ampullary lesion.  This is difficult to discretely measure on today's MR images secondary to motion, but a suspected to be approximately 1.5 cm in size or less. The dorsal pancreatic duct is normal in caliber, and there appears to be pancreatic divisum anatomy.  This is best demonstrated on images 22 - 24 of series 4, but is poorly evaluated on other pulse sequences  secondary to motion. The ventral pancreatic duct is also normal in caliber.  In the region of the uncinate process of the pancreas there is a tiny 8 x 5 mm lesion that is low signal intensity on T1- weighted images, high signal intensity on T2-weighted images, and does not appear to enhance.  No peripancreatic lymphadenopathy is noted.  No focal hepatic lesions are identified.  Diffuse loss of signal intensity throughout the hepatic parenchyma on the out of phase dual echo images, compatible with severe hepatic steatosis. The visualized portions of the spleen, bilateral adrenal glands and bilateral kidneys are unremarkable.  IMPRESSION:  1.  Mild to moderate intrahepatic biliary ductal dilatation, and dilatation of the common bile duct which abruptly tapers at the level of the ampulla.  It is uncertain whether not this is simply related to a distal common bile duct stricture, or is related to an underlying ampullary neoplasm.  Further evaluation with endoscopic ultrasound is recommended at this time. 2.  There appears to be pancreatic divisum anatomy, as discussed above. 3.  No evidence of cholelithiasis or choledocholithiasis on today's examination. 4.  8 x 5 mm cystic lesion in the uncinate process of the pancreas is nonspecific and warrants attention on follow-up studies.  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  A 1-year follow-up MRI of the abdomen with contrast is recommended. 5.  Severe hepatic steatosis.   Original Report Authenticated By: Trudie Reed, M.D.    Scheduled Meds: . ciprofloxacin  400 mg Intravenous Q12H  . heparin  5,000 Units Subcutaneous Q8H  . levothyroxine  50 mcg Intravenous QAC breakfast  . LORazepam  0.5 mg Intravenous Once  . ondansetron  4 mg Oral Once  . pantoprazole (PROTONIX) IV  40 mg Intravenous Q24H  . sodium chloride  3 mL Intravenous Q12H   Continuous Infusions: . sodium chloride 0.9 % 1,000  mL with potassium chloride 20 mEq infusion      Principal Problem:   Acute epigastric pain Active Problems:   Dilated cbd, acquired    Algis Downs  Griffith Citron  [facility] Pager [phone]. If 7PM-7AM, please contact night-coverage at [email or web address], password [facility] 09/14/2012, 12:10 PM  LOS: 1 day

## 2012-09-14 NOTE — Progress Notes (Signed)
Addendum  Patient seen and examined, chart and data base reviewed.  I agree with the above assessment and plan.  For full details please see Mrs. Algis Downs PA note.  Per Gi likely to have ERCP/EUS.   Clint Lipps, MD [facility] Pager: [phone] 09/14/2012, 1:38 PM

## 2012-09-14 NOTE — Consult Note (Signed)
Subjective:   HPI  The patient is a 57 year old female who on Sunday developed severe epigastric pain which radiated through to her back. She came to the emergency room and was treated for this pain and subsequently sent home. She came back the following day because of persistent pain in the same area. Further evaluation revealed elevated liver enzymes, and a mildly elevated lipase. She has been having intermittent discomfort in the epigastric area for quite some time. An abdominal ultrasound did not reveal gallstones. An MRCP was done which showed mild to moderate dilatation of the common bile duct with some tapering distally of the common bile duct. It is unclear as to whether this represents a stricture or whether there could be an ampullary tumor. There was also evidence of pancreas divisum and an 8x5 mm cyst in the uncinate process. She gives no history of ever having had pancreatitis before.  Review of Systems No chest pain or shortness of breath  Past Medical History  Diagnosis Date  . Pancreatitis   . Thyroid disease    Past Surgical History  Procedure Laterality Date  . Cesarean section    . Appendectomy    . Abdominal hysterectomy     History   Social History  . Marital Status: Married    Spouse Name: N/A    Number of Children: N/A  . Years of Education: N/A   Occupational History  . Not on file.   Social History Main Topics  . Smoking status: Never Smoker   . Smokeless tobacco: Not on file  . Alcohol Use: No  . Drug Use: No  . Sexually Active: Not on file   Other Topics Concern  . Not on file   Social History Narrative  . No narrative on file   family history is not on file. Current facility-administered medications:0.9 %  sodium chloride infusion, , Intravenous, Continuous, Stephani Police, PA-C, Last Rate: 150 mL/hr at 09/14/12 0158;  ciprofloxacin (CIPRO) IVPB 400 mg, 400 mg, Intravenous, Q12H, Barrie Folk, MD, 400 mg at 09/14/12 0522;  heparin injection  5,000 Units, 5,000 Units, Subcutaneous, Q8H, Stephani Police, PA-C, 5,000 Units at 09/14/12 0522 HYDROmorphone (DILAUDID) injection 1 mg, 1 mg, Intravenous, Q3H PRN, Stephani Police, PA-C, 1 mg at 09/14/12 0940;  ibuprofen (ADVIL,MOTRIN) tablet 400 mg, 400 mg, Oral, Q6H PRN, Clydia Llano, MD;  levothyroxine (SYNTHROID, LEVOTHROID) injection 50 mcg, 50 mcg, Intravenous, QAC breakfast, Clydia Llano, MD, 50 mcg at 09/14/12 1610;  LORazepam (ATIVAN) injection 0.5 mg, 0.5 mg, Intravenous, Once, Marianne L York, PA-C LORazepam (ATIVAN) injection 0.5 mg, 0.5 mg, Intravenous, Q6H PRN, Tora Kindred York, PA-C;  ondansetron (ZOFRAN) injection 4 mg, 4 mg, Intravenous, Q6H PRN, Tora Kindred York, PA-C, 4 mg at 09/14/12 0401;  ondansetron (ZOFRAN) tablet 4 mg, 4 mg, Oral, Q6H PRN, Tora Kindred York, PA-C;  ondansetron (ZOFRAN-ODT) disintegrating tablet 4 mg, 4 mg, Oral, Once, Charles B. Bernette Mayers, MD pantoprazole (PROTONIX) injection 40 mg, 40 mg, Intravenous, Q24H, Marianne L York, PA-C, 40 mg at 09/13/12 1757;  sodium chloride 0.9 % injection 3 mL, 3 mL, Intravenous, Q12H, Marianne L York, PA-C, 3 mL at 09/14/12 0930 Allergies  Allergen Reactions  . Darvocet (Propoxyphene-Acetaminophen)      Objective:     BP 97/60  Pulse 85  Temp(Src) 98.5 F (36.9 C) (Oral)  Resp 16  Ht 5\' 4"  (1.626 m)  Wt 95.936 kg (211 lb 8 oz)  BMI 36.29 kg/m2  SpO2 96%  She is  in no acute distress  Nonicteric  Heart regular rhythm no murmurs  Lungs clear  Abdomen: Bowel sounds normal, soft, there is some tenderness in the epigastric region  Laboratory No components found with this basename: d1      Assessment:     #1. Epigastric pain  #2. Elevated liver enzymes  #3. Mild elevation of lipase which has resolved.  4. Dilation of the common bile duct with tapering of the distal common bile duct of uncertain etiology. Considerations are distal bile duct stricture, versus ampullary tumor.  #4. 8 mm x5 mm cyst in the  uncinate process of the pancreas. This does not appear to be impinging on the common bile duct or causing the stricture.      Plan:     At this time I would recommend continuing supportive care. Follow liver enzymes. I will ask my partner Dr. Dulce Sellar to look at the MRCP and determine whether or not he thinks we should proceed with endoscopic ultrasound or ERCP or both.    Component Value Date/Time   WBC 3.5* 09/14/2012 0845   HGB 11.5* 09/14/2012 0845   HCT 35.0* 09/14/2012 0845   PLT 144* 09/14/2012 0845   ALT 593* 09/14/2012 0845   AST 453* 09/14/2012 0845   NA 136 09/14/2012 0845   K 3.3* 09/14/2012 0845   CL 103 09/14/2012 0845   CREATININE 0.99 09/14/2012 0845   BUN 15 09/14/2012 0845   CO2 21 09/14/2012 0845   CALCIUM 8.0* 09/14/2012 0845   ALKPHOS 113 09/14/2012 0845

## 2012-09-14 NOTE — Care Management Note (Unsigned)
    Page 1 of 1   09/14/2012     4:20:21 PM   CARE MANAGEMENT NOTE 09/14/2012  Patient:  Sheryl Turner, Sheryl Turner   Account Number:  [id number]  Date Initiated:  09/14/2012  Documentation initiated by:  Letha Cape  Subjective/Objective Assessment:   dx acute epigastric pain  admit- lives with spouse, pta indep.     Action/Plan:   Anticipated DC Date:  09/16/2012   Anticipated DC Plan:  HOME/SELF CARE      DC Planning Services  CM consult      Choice offered to / List presented to:             Status of service:  In process, will continue to follow Medicare Important Message given?   (If response is "NO", the following Medicare IM given date fields will be blank) Date Medicare IM given:   Date Additional Medicare IM given:    Discharge Disposition:    Per UR Regulation:  Reviewed for med. necessity/level of care/duration of stay  If discussed at Long Length of Stay Meetings, dates discussed:    Comments:  7/'1/14 16:19 Alcide Evener RN, BSN [phone] patient lives with spouse, pta indep.  NCM will continue to follow for dc needs.

## 2012-09-15 ENCOUNTER — Encounter (HOSPITAL_COMMUNITY)
Admission: EM | Disposition: A | Discharge status: Home / Self Care | Payer: Self-pay | Source: Home / Self Care | Attending: Internal Medicine

## 2012-09-15 ENCOUNTER — Encounter (HOSPITAL_COMMUNITY): Payer: Self-pay | Admitting: *Deleted

## 2012-09-15 ENCOUNTER — Encounter (HOSPITAL_COMMUNITY): Payer: Self-pay | Admitting: Anesthesiology

## 2012-09-15 ENCOUNTER — Inpatient Hospital Stay (HOSPITAL_COMMUNITY): Payer: MEDICAID | Admitting: Anesthesiology

## 2012-09-15 HISTORY — PX: EUS: SHX5427

## 2012-09-15 LAB — LIPASE, BLOOD: Lipase: 12 U/L (ref 11–59)

## 2012-09-15 LAB — HEPATIC FUNCTION PANEL
ALT: 462 U/L — ABNORMAL HIGH (ref 0–35)
AST: 345 U/L — ABNORMAL HIGH (ref 0–37)
Alkaline Phosphatase: 99 U/L (ref 39–117)
Bilirubin, Direct: 2.7 mg/dL — ABNORMAL HIGH (ref 0.0–0.3)
Indirect Bilirubin: 0.9 mg/dL (ref 0.3–0.9)
Total Protein: 5.7 g/dL — ABNORMAL LOW (ref 6.0–8.3)

## 2012-09-15 LAB — CBC
HCT: 32.4 % — ABNORMAL LOW (ref 36.0–46.0)
MCH: 30.5 pg (ref 26.0–34.0)
MCHC: 32.7 g/dL (ref 30.0–36.0)
RDW: 15.1 % (ref 11.5–15.5)

## 2012-09-15 LAB — BASIC METABOLIC PANEL
Creatinine, Ser: 1 mg/dL (ref 0.50–1.10)
Potassium: 3.8 mEq/L (ref 3.5–5.1)

## 2012-09-15 LAB — HEPATITIS PANEL, ACUTE
HCV Ab: NEGATIVE
Hepatitis B Surface Ag: NEGATIVE

## 2012-09-15 SURGERY — UPPER ENDOSCOPIC ULTRASOUND (EUS) RADIAL
Anesthesia: Monitor Anesthesia Care | Laterality: Left

## 2012-09-15 SURGERY — ESOPHAGEAL ENDOSCOPIC ULTRASOUND (EUS) RADIAL
Anesthesia: Monitor Anesthesia Care

## 2012-09-15 MED ORDER — OXYCODONE HCL 5 MG/5ML PO SOLN: 5.0000 mg | Freq: Once | ORAL | Status: AC | PRN

## 2012-09-15 MED ORDER — PROMETHAZINE HCL 25 MG/ML IJ SOLN
6.2500 mg | INTRAMUSCULAR | Status: DC | PRN
  Filled 2012-09-15: qty 1

## 2012-09-15 MED ORDER — ACETAMINOPHEN 10 MG/ML IV SOLN
1000.0000 mg | Freq: Once | INTRAVENOUS | Status: AC | PRN
  Filled 2012-09-15: qty 100

## 2012-09-15 MED ORDER — OXYCODONE HCL 5 MG PO TABS: 5.0000 mg | ORAL_TABLET | Freq: Once | ORAL | Status: AC | PRN

## 2012-09-15 MED ORDER — KETAMINE HCL 10 MG/ML IJ SOLN
INTRAMUSCULAR | Status: DC | PRN
  Administered 2012-09-15: 20 mg via INTRAVENOUS

## 2012-09-15 MED ORDER — MEPERIDINE HCL 25 MG/ML IJ SOLN: 6.2500 mg | INTRAMUSCULAR | Status: DC | PRN

## 2012-09-15 MED ORDER — SODIUM CHLORIDE 0.9 % IV SOLN: INTRAVENOUS | Status: DC

## 2012-09-15 MED ORDER — ONDANSETRON HCL 4 MG/2ML IJ SOLN
INTRAMUSCULAR | Status: DC | PRN
  Administered 2012-09-15: 4 mg via INTRAVENOUS

## 2012-09-15 MED ORDER — BUTAMBEN-TETRACAINE-BENZOCAINE 2-2-14 % EX AERO
INHALATION_SPRAY | CUTANEOUS | Status: DC | PRN
  Administered 2012-09-15: 2 via TOPICAL

## 2012-09-15 MED ORDER — LACTATED RINGERS IV SOLN
INTRAVENOUS | Status: DC
  Administered 2012-09-15: 1000 mL via INTRAVENOUS

## 2012-09-15 MED ORDER — PROPOFOL INFUSION 10 MG/ML OPTIME
INTRAVENOUS | Status: DC | PRN
  Administered 2012-09-15: 120 ug/kg/min via INTRAVENOUS

## 2012-09-15 MED ORDER — LIDOCAINE HCL (CARDIAC) 20 MG/ML IV SOLN
INTRAVENOUS | Status: DC | PRN
  Administered 2012-09-15: 100 mg via INTRAVENOUS

## 2012-09-15 MED ORDER — HYDROMORPHONE HCL PF 1 MG/ML IJ SOLN
0.2500 mg | INTRAMUSCULAR | Status: DC | PRN
  Filled 2012-09-15: qty 1

## 2012-09-15 NOTE — Transfer of Care (Signed)
Immediate Anesthesia Transfer of Care Note  Patient: Sheryl Turner  Procedure(s) Performed: Procedure(s) (LRB): UPPER ENDOSCOPIC ULTRASOUND (EUS) RADIAL, possible LINEAR to follow, possible side-viewing endoscope (Left)  Patient Location: PACU  Anesthesia Type: MAC  Level of Consciousness: sedated, patient cooperative and responds to stimulaton  Airway & Oxygen Therapy: Patient Spontanous Breathing and Patient connected to face mask oxgen  Post-op Assessment: Report given to PACU RN and Post -op Vital signs reviewed and stable  Post vital signs: Reviewed and stable  Complications: No apparent anesthesia complications

## 2012-09-15 NOTE — Op Note (Signed)
[facility] [address] [place], [address]   ENDOSCOPIC ULTRASOUND PROCEDURE REPORT  PATIENT: Sheryl, Turner  MR#: [id number] BIRTHDATE: 1955-10-02  GENDER: Female ENDOSCOPIST: Willis Modena, MD REFERRED BY:  Wandalee Ferdinand, M.D. PROCEDURE DATE:  09/15/2012 PROCEDURE:   Upper EUS ASA CLASS:      Class II INDICATIONS:   1.  right upper quadrant abdominal pain, elevated LFTs. MEDICATIONS: MAC sedation, administered by CRNA and Cetacaine spray x 2  DESCRIPTION OF PROCEDURE:   After the risks benefits and alternatives of the procedure were  explained, informed consent was obtained. The patient was then placed in the left, lateral, decubitus postion and IV sedation was administered. Throughout the procedure, the patients blood pressure, pulse and oxygen saturations were monitored continuously.  Under direct visualization, the Pentax Radial EUS [id number] and Pentax side-viewing duodenoscope was introduced through the mouth  and advanced to the second portion of the duodenum .  Water was used as necessary to provide an acoustic interface.  Upon completion of the imaging, water was removed and the patient was sent to the recovery room in satisfactory condition.    FINDINGS:      Radial EUS and side-viewing duodenoscopes used.  Head and uncinate pancreas normal; no mass seen.  Ampulla was prominent but did not have a mass-like appearance.  Patient has a   1cm diameter periampullary diverticulum visible with indwelling debris; this diverticulum likely represents the uncinate "cyst" seen on MRI.  Common hepatic duct is   15mm in size and slowly tapers to approximately 8-71mm  within the intrapancreatic portion.  There is abrupt cut-off of bile duct at level of ampulla.  However, endoscopically and via EUS, no ampullary or pancreatic mass was seen.  Bile duct had large amounts of indwelling sludge. Gallbladder is likewise prominent and has indwelling sludge.   Some triangular benign-appearing periportal lymph nodes were seen.  IMPRESSION:     As above.  Overall constellation of clinical, radiographic, and endoscopic data most favor benign short-segment distal bile duct stricture from passage of sludge/stones.  Very diminutive mass or very subtle short-segment cholangiocarcinoma can't be entirely excluded, but are felt to be much less likely.   RECOMMENDATIONS:     1.  Watch for potential complications of procedure. 2.  Patient will need ERCP with biliary stricture brushings and bile duct stent placement, to be coordinated at [facility] tomorrow by Dr. Madilyn Fireman. 3.  After resolution of biliary obstruction issues, would consider surgical consultation for consideration of cholecystectomy. 4.  [facility] inpatient team to follow.   _______________________________ Rosalie DoctorWillis Modena, MD 09/15/2012 2:51 PM   CC:[Carbon Copy]

## 2012-09-15 NOTE — Anesthesia Preprocedure Evaluation (Addendum)
Anesthesia Evaluation  Patient identified by MRN, date of birth, ID band Patient awake    Reviewed: Allergy & Precautions, H&P , NPO status , Patient's Chart, lab work & pertinent test results  Airway Mallampati: III TM Distance: >3 FB Neck ROM: Full    Dental  (+) Dental Advisory Given and Teeth Intact   Pulmonary shortness of breath,  breath sounds clear to auscultation        Cardiovascular negative cardio ROS  Rhythm:Regular Rate:Normal     Neuro/Psych negative neurological ROS  negative psych ROS   GI/Hepatic Neg liver ROS, hiatal hernia, GERD-  Medicated,  Endo/Other  Hypothyroidism   Renal/GU negative Renal ROS     Musculoskeletal negative musculoskeletal ROS (+)   Abdominal (+) + obese,   Peds  Hematology negative hematology ROS (+)   Anesthesia Other Findings   Reproductive/Obstetrics negative OB ROS                          Anesthesia Physical Anesthesia Plan  ASA: II  Anesthesia Plan: MAC   Post-op Pain Management:    Induction: Intravenous  Airway Management Planned: Nasal Cannula and Simple Face Mask  Additional Equipment:   Intra-op Plan:   Post-operative Plan:   Informed Consent: I have reviewed the patients History and Physical, chart, labs and discussed the procedure including the risks, benefits and alternatives for the proposed anesthesia with the patient or authorized representative who has indicated his/her understanding and acceptance.   Dental advisory given  Plan Discussed with: CRNA  Anesthesia Plan Comments:         Anesthesia Quick Evaluation

## 2012-09-15 NOTE — Progress Notes (Signed)
[facility] PROGRESS NOTE  Sheryl Turner ZOX:[id number] DOB: 1955/04/20 DOA: 09/13/2012 PCP: Willow Ora, MD  HPI/Subjective: She still has episode of being cold/hot.  Assessment/Plan:  Acute onset Epigastric pain with nausea   Dilated CBD on u/s 6/29, but no gall stones. Negative murphy's sign   MRCP 7/1 shows dilation of the CBD with abrupt tapering of the distal cbd at the ampulla.  Considerations are distal bile duct stricture, versus ampullary tumor  ERCP / EUS per [facility] today  Although she has normal lipase, will treat like pancreatitis: NPO, ice chips, IV Hydration, pain meds, Zofran   We appreciate [facility]'s consultation.   transaminases trending down.  Acute Hep panel pending.  Pancreatic pseudocyst  8x5 mm cyst on uncinate process of pancreas.  Possible pseudocyst  Patient denies history of pancreatitis.  Hypothyroidism   Continue synthroid   GERD   Protonix  Mild Hypokalemia  Replete in IVF.  Anxiety  Low dose prn ativan IV  Thrombocytopenia  Patient came in with platelets of 176, which was trending down, today is 99.  Still appropriate, could be secondary to acute illness, will follow closely.   DVT Prophylaxis:  lovenox  Code Status: full Family Communication: Disposition Plan: Inpatient.  D/C to home when medically appropriate.   Consultants:  [facility]  Procedures:  EUS today  Antibiotics:  Cipro  Objective: Filed Vitals:   09/14/12 1417 09/14/12 2100 09/15/12 0500 09/15/12 1253  BP: 81/53 96/58 101/52 112/65  Pulse: 95 102 97   Temp: 97.9 F (36.6 C) 98 F (36.7 C) 98.5 F (36.9 C) 98.1 F (36.7 C)  TempSrc: Oral Oral Oral Oral  Resp: 17 18 20 13   Height:      Weight:      SpO2: 95% 92% 96% 94%    Intake/Output Summary (Last 24 hours) at 09/15/12 1359 Last data filed at 09/15/12 0620  Gross per 24 hour  Intake 2504.17 ml  Output      0 ml  Net 2504.17 ml   Filed Weights   09/13/12 1700   Weight: 95.936 kg (211 lb 8 oz)    Exam:   General:  A&O, slightly anxious, lying in bed. No jaundice or icterus.  Cardiovascular: RRR, no murmurs, rubs or gallops, no lower extremity edema  Respiratory: CTA, no wheeze, crackles, or rales.  No increased work of breathing.  Abdomen: Soft, non-tender, non-distended, + bowel sounds, no masses  Musculoskeletal: Able to move all 4 extremities, 5/5 strength in each  Data Reviewed: Basic Metabolic Panel:  Recent Labs Lab 09/12/12 1630 09/13/12 1146 09/14/12 0631 09/14/12 0845 09/15/12 0653  NA 137 135 138 136 137  K 3.5 3.8 3.7 3.3* 3.8  CL 100 98 104 103 105  CO2 24 21 23 21 20   GLUCOSE 97 136* 111* 87 117*  BUN 16 11 14 15 22   CREATININE 0.93 0.73 1.01 0.99 1.00  CALCIUM 9.4 9.2 8.1* 8.0* 7.6*   Liver Function Tests:  Recent Labs Lab 09/12/12 1630 09/13/12 1146 09/14/12 0631 09/14/12 0845 09/15/12 0653  AST 62* 839* 516* 453* 345*  ALT 49* 716* 648* 593* 462*  ALKPHOS 68 95 95 113 99  BILITOT 0.4 2.1* 3.5* 3.9* 3.6*  PROT 7.4 7.3 6.5 6.1 5.7*  ALBUMIN 4.2 4.1 3.3* 3.2* 2.7*    Recent Labs Lab 09/12/12 1637 09/13/12 1146 09/14/12 0845 09/15/12 0653  LIPASE 79* 37 25 12  AMYLASE  --   --  29  --  No results found for this basename: AMMONIA,  in the last 168 hours CBC:  Recent Labs Lab 09/12/12 1630 09/13/12 1146 2012/09/17 0631 17-Sep-2012 0845 09/15/12 0653  WBC 6.7 10.0 10.8* 3.5* 9.9  NEUTROABS 3.6 9.3*  --   --   --   HGB 13.5 13.7 12.0 11.5* 10.6*  HCT 40.3 40.9 36.7 35.0* 32.4*  MCV 92.6 91.9 93.9 93.1 93.4  PLT 234 215 176 144* 99*   Cardiac Enzymes:  Recent Labs Lab 09/12/12 1637  TROPONINI <0.30   BNP (last 3 results) No results found for this basename: PROBNP,  in the last 8760 hours CBG: No results found for this basename: GLUCAP,  in the last 168 hours  Recent Results (from the past 240 hour(s))  CULTURE, BLOOD (ROUTINE X 2)     Status: None   Collection Time    09/13/12   8:00 PM      Result Value Range Status   Specimen Description BLOOD ARM LEFT   Final   Special Requests BOTTLES DRAWN AEROBIC ONLY 5CC   Final   Culture  Setup Time 17-Sep-2012 03:48   Final   Culture     Final   Value:        BLOOD CULTURE RECEIVED NO GROWTH TO DATE CULTURE WILL BE HELD FOR 5 DAYS BEFORE ISSUING A FINAL NEGATIVE REPORT   Report Status PENDING   Incomplete  CULTURE, BLOOD (ROUTINE X 2)     Status: None   Collection Time    09/13/12  8:15 PM      Result Value Range Status   Specimen Description BLOOD HAND LEFT   Final   Special Requests BOTTLES DRAWN AEROBIC ONLY 5CC   Final   Culture  Setup Time September 17, 2012 03:29   Final   Culture     Final   Value:        BLOOD CULTURE RECEIVED NO GROWTH TO DATE CULTURE WILL BE HELD FOR 5 DAYS BEFORE ISSUING A FINAL NEGATIVE REPORT   Report Status PENDING   Incomplete     Studies: Mr 3d Recon At Scanner  09/17/12   *RADIOLOGY REPORT*  Clinical Data:  Right upper quadrant pain, nausea and vomiting. Dilated common bile duct.  MRI ABDOMEN WITHOUT AND WITH CONTRAST (INCLUDING MRCP)  Technique:  Multiplanar multisequence MR imaging of the abdomen was performed both before and after the administration of intravenous contrast. Heavily T2-weighted images of the biliary and pancreatic ducts were obtained, and three-dimensional MRCP images were rendered by post processing.  Contrast: 20mL MULTIHANCE GADOBENATE DIMEGLUMINE 529 MG/ML IV SOLN  Comparison:  Ultrasound of the abdomen dated 09/12/2012.  Comment:  The examination is suboptimal related to extensive patient respiratory motion.  Findings:  MRCP images demonstrate mild to moderate intrahepatic biliary ductal dilatation.  The bile ducts do not have a beaded appearance.  The common bile duct is also dilated measuring up to 13 mm in the porta hepatis.  Gallbladder is moderately distended. No gallbladder wall thickening or pericholecystic fluid.  Notably, there are no signal voids within the  gallbladder or the common bile duct to suggest the presence of cholelithiasis or choledocholithiasis.  The common bile duct abruptly tapers to a normal caliber distally at the level of the ampulla.  This tapering appears to be rather abrupt, and there is some soft tissue fullness at the level of the ampulla where there appears to be a small amount of soft tissue which projects into the lumen of the duodenum (  best demonstrated on images 26 of series 4 and 5).  Post gadolinium images demonstrate some progressive enhancement in this region (best demonstrated on image 74 of series 1804), suspicious for a small ampullary lesion.  This is difficult to discretely measure on today's MR images secondary to motion, but a suspected to be approximately 1.5 cm in size or less. The dorsal pancreatic duct is normal in caliber, and there appears to be pancreatic divisum anatomy.  This is best demonstrated on images 22 - 24 of series 4, but is poorly evaluated on other pulse sequences secondary to motion. The ventral pancreatic duct is also normal in caliber.  In the region of the uncinate process of the pancreas there is a tiny 8 x 5 mm lesion that is low signal intensity on T1- weighted images, high signal intensity on T2-weighted images, and does not appear to enhance.  No peripancreatic lymphadenopathy is noted.  No focal hepatic lesions are identified.  Diffuse loss of signal intensity throughout the hepatic parenchyma on the out of phase dual echo images, compatible with severe hepatic steatosis. The visualized portions of the spleen, bilateral adrenal glands and bilateral kidneys are unremarkable.  IMPRESSION:  1.  Mild to moderate intrahepatic biliary ductal dilatation, and dilatation of the common bile duct which abruptly tapers at the level of the ampulla.  It is uncertain whether not this is simply related to a distal common bile duct stricture, or is related to an underlying ampullary neoplasm.  Further evaluation with  endoscopic ultrasound is recommended at this time. 2.  There appears to be pancreatic divisum anatomy, as discussed above. 3.  No evidence of cholelithiasis or choledocholithiasis on today's examination. 4.  8 x 5 mm cystic lesion in the uncinate process of the pancreas is nonspecific and warrants attention on follow-up studies.  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  A 1-year follow-up MRI of the abdomen with contrast is recommended. 5.  Severe hepatic steatosis.   Original Report Authenticated By: Trudie Reed, M.D.   Mr Abd W/wo Cm/mrcp  09/14/2012   *RADIOLOGY REPORT*  Clinical Data:  Right upper quadrant pain, nausea and vomiting. Dilated common bile duct.  MRI ABDOMEN WITHOUT AND WITH CONTRAST (INCLUDING MRCP)  Technique:  Multiplanar multisequence MR imaging of the abdomen was performed both before and after the administration of intravenous contrast. Heavily T2-weighted images of the biliary and pancreatic ducts were obtained, and three-dimensional MRCP images were rendered by post processing.  Contrast: 20mL MULTIHANCE GADOBENATE DIMEGLUMINE 529 MG/ML IV SOLN  Comparison:  Ultrasound of the abdomen dated 09/12/2012.  Comment:  The examination is suboptimal related to extensive patient respiratory motion.  Findings:  MRCP images demonstrate mild to moderate intrahepatic biliary ductal dilatation.  The bile ducts do not have a beaded appearance.  The common bile duct is also dilated measuring up to 13 mm in the porta hepatis.  Gallbladder is moderately distended. No gallbladder wall thickening or pericholecystic fluid.  Notably, there are no signal voids within the gallbladder or the common bile duct to suggest the presence of cholelithiasis or choledocholithiasis.  The common bile duct abruptly tapers to a normal caliber distally at the level of the ampulla.  This tapering appears to be rather abrupt, and there is some soft tissue  fullness at the level of the ampulla where there appears to be a small amount of soft tissue which projects into the lumen of the duodenum (best  demonstrated on images 26 of series 4 and 5).  Post gadolinium images demonstrate some progressive enhancement in this region (best demonstrated on image 74 of series 1804), suspicious for a small ampullary lesion.  This is difficult to discretely measure on today's MR images secondary to motion, but a suspected to be approximately 1.5 cm in size or less. The dorsal pancreatic duct is normal in caliber, and there appears to be pancreatic divisum anatomy.  This is best demonstrated on images 22 - 24 of series 4, but is poorly evaluated on other pulse sequences secondary to motion. The ventral pancreatic duct is also normal in caliber.  In the region of the uncinate process of the pancreas there is a tiny 8 x 5 mm lesion that is low signal intensity on T1- weighted images, high signal intensity on T2-weighted images, and does not appear to enhance.  No peripancreatic lymphadenopathy is noted.  No focal hepatic lesions are identified.  Diffuse loss of signal intensity throughout the hepatic parenchyma on the out of phase dual echo images, compatible with severe hepatic steatosis. The visualized portions of the spleen, bilateral adrenal glands and bilateral kidneys are unremarkable.  IMPRESSION:  1.  Mild to moderate intrahepatic biliary ductal dilatation, and dilatation of the common bile duct which abruptly tapers at the level of the ampulla.  It is uncertain whether not this is simply related to a distal common bile duct stricture, or is related to an underlying ampullary neoplasm.  Further evaluation with endoscopic ultrasound is recommended at this time. 2.  There appears to be pancreatic divisum anatomy, as discussed above. 3.  No evidence of cholelithiasis or choledocholithiasis on today's examination. 4.  8 x 5 mm cystic lesion in the uncinate process of the pancreas is  nonspecific and warrants attention on follow-up studies.  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  A 1-year follow-up MRI of the abdomen with contrast is recommended. 5.  Severe hepatic steatosis.   Original Report Authenticated By: Trudie Reed, M.D.    Scheduled Meds: . ciprofloxacin  400 mg Intravenous Q12H  . enoxaparin  40 mg Subcutaneous Q24H  . levothyroxine  50 mcg Intravenous QAC breakfast  . LORazepam  0.5 mg Intravenous Once  . ondansetron  4 mg Oral Once  . pantoprazole (PROTONIX) IV  40 mg Intravenous Q24H  . sodium chloride  3 mL Intravenous Q12H   Continuous Infusions: . sodium chloride    . lactated ringers 1,000 mL (09/15/12 1307)  . sodium chloride 0.9 % 1,000 mL with potassium chloride 20 mEq infusion 125 mL/hr at 09/15/12 1137    Principal Problem:   Acute epigastric pain Active Problems:   Dilated cbd, acquired    [facility]  [facility] Pager [phone]. If 7PM-7AM, please contact night-coverage at [email or web address], password [facility] 09/15/2012, 1:59 PM  LOS: 2 days

## 2012-09-15 NOTE — Preoperative (Signed)
Beta Blockers   Reason not to administer Beta Blockers:Not Applicable 

## 2012-09-15 NOTE — Interval H&P Note (Signed)
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 After consideration of risks, benefits and other options for treatment, the patient has consented to  Procedure(s): UPPER ENDOSCOPIC ULTRASOUND (EUS) RADIAL, possible LINEAR to follow, possible side-viewing endoscope (Left) as a surgical intervention .  The patient's history has been reviewed, patient examined, no change in status, stable for surgery.  I have reviewed the patient's chart and labs.  Questions were answered to the patient's satisfaction.    Assessment:  1.  Obstructive jaundice.  Suggestion of possible ampullary lesion on MRI/MRCP.  Plan:  1.  Endoscopic ultrasound with possible biopsies (FNA or mucosal). 2.  Risks (bleeding, infection, bowel perforation that could require surgery, sedation-related changes in cardiopulmonary systems), benefits (identification and possible treatment of source of symptoms, exclusion of certain causes of symptoms), and alternatives (watchful waiting, radiographic imaging studies, empiric medical treatment) of upper endoscopy with ultrasound and possible biopsies (EUS +/- FNA, mucosal biopsies) were explained to patient/family in detail and patient wishes to proceed. 3.  Next step in management pending endoscopic ultrasound results.  Freddy Jaksch

## 2012-09-16 ENCOUNTER — Encounter (HOSPITAL_COMMUNITY): Payer: Self-pay | Admitting: Anesthesiology

## 2012-09-16 ENCOUNTER — Encounter (HOSPITAL_COMMUNITY)
Admission: EM | Disposition: A | Discharge status: Home / Self Care | Payer: Self-pay | Source: Home / Self Care | Attending: Internal Medicine

## 2012-09-16 ENCOUNTER — Inpatient Hospital Stay (HOSPITAL_COMMUNITY): Payer: MEDICAID | Admitting: Anesthesiology

## 2012-09-16 ENCOUNTER — Encounter (HOSPITAL_COMMUNITY): Payer: Self-pay | Admitting: Gastroenterology

## 2012-09-16 ENCOUNTER — Inpatient Hospital Stay (HOSPITAL_COMMUNITY): Payer: MEDICAID

## 2012-09-16 HISTORY — PX: ERCP: SHX5425

## 2012-09-16 LAB — CBC
HCT: 34.2 % — ABNORMAL LOW (ref 36.0–46.0)
MCH: 30.3 pg (ref 26.0–34.0)
MCV: 93.4 fL (ref 78.0–100.0)
RBC: 3.66 MIL/uL — ABNORMAL LOW (ref 3.87–5.11)
RDW: 15.2 % (ref 11.5–15.5)
WBC: 8.6 10*3/uL (ref 4.0–10.5)

## 2012-09-16 LAB — COMPREHENSIVE METABOLIC PANEL
AST: 349 U/L — ABNORMAL HIGH (ref 0–37)
Albumin: 2.6 g/dL — ABNORMAL LOW (ref 3.5–5.2)
Alkaline Phosphatase: 133 U/L — ABNORMAL HIGH (ref 39–117)
BUN: 12 mg/dL (ref 6–23)
Potassium: 3.9 mEq/L (ref 3.5–5.1)
Total Protein: 6.1 g/dL (ref 6.0–8.3)

## 2012-09-16 SURGERY — ERCP, WITH INTERVENTION IF INDICATED
Anesthesia: General

## 2012-09-16 MED ORDER — PROPOFOL 10 MG/ML IV BOLUS
INTRAVENOUS | Status: DC | PRN
  Administered 2012-09-16: 200 mg via INTRAVENOUS

## 2012-09-16 MED ORDER — PROMETHAZINE HCL 25 MG/ML IJ SOLN
12.5000 mg | Freq: Four times a day (QID) | INTRAMUSCULAR | Status: DC | PRN
Start: 2012-09-16 — End: 2012-09-19
  Administered 2012-09-16: 12.5 mg via INTRAVENOUS
  Filled 2012-09-16: qty 1

## 2012-09-16 MED ORDER — ONDANSETRON HCL 4 MG/2ML IJ SOLN
INTRAMUSCULAR | Status: DC | PRN
  Administered 2012-09-16: 4 mg via INTRAVENOUS

## 2012-09-16 MED ORDER — LIDOCAINE HCL (CARDIAC) 20 MG/ML IV SOLN
INTRAVENOUS | Status: DC | PRN
  Administered 2012-09-16: 80 mg via INTRAVENOUS

## 2012-09-16 MED ORDER — SODIUM CHLORIDE 0.9 % IV SOLN
INTRAVENOUS | Status: DC | PRN
  Administered 2012-09-16: 15:00:00

## 2012-09-16 MED ORDER — ARTIFICIAL TEARS OP OINT
TOPICAL_OINTMENT | OPHTHALMIC | Status: DC | PRN
  Administered 2012-09-16: 1 via OPHTHALMIC

## 2012-09-16 MED ORDER — LACTATED RINGERS IV SOLN
INTRAVENOUS | Status: DC | PRN
  Administered 2012-09-16: 14:00:00 via INTRAVENOUS

## 2012-09-16 MED ORDER — SUCCINYLCHOLINE CHLORIDE 20 MG/ML IJ SOLN
INTRAMUSCULAR | Status: DC | PRN
  Administered 2012-09-16: 120 mg via INTRAVENOUS

## 2012-09-16 MED ORDER — LACTATED RINGERS IV SOLN
INTRAVENOUS | Status: DC
  Administered 2012-09-16: 13:00:00 via INTRAVENOUS

## 2012-09-16 MED ORDER — LIDOCAINE HCL 4 % MT SOLN
OROMUCOSAL | Status: DC | PRN
  Administered 2012-09-16: 4 mL via TOPICAL

## 2012-09-16 MED ORDER — FENTANYL CITRATE 0.05 MG/ML IJ SOLN
INTRAMUSCULAR | Status: DC | PRN
  Administered 2012-09-16: 50 ug via INTRAVENOUS

## 2012-09-16 NOTE — Anesthesia Procedure Notes (Signed)
Procedure Name: Intubation Date/Time: 09/16/2012 2:21 PM Performed by: Gayla Medicus Pre-anesthesia Checklist: Patient identified, Emergency Drugs available, Patient being monitored, Suction available and Timeout performed Patient Re-evaluated:Patient Re-evaluated prior to inductionOxygen Delivery Method: Circle system utilized Preoxygenation: Pre-oxygenation with 100% oxygen Intubation Type: IV induction Ventilation: Mask ventilation without difficulty Laryngoscope Size: Mac and 3 Grade View: Grade I Tube type: Oral Tube size: 7.5 mm Number of attempts: 1 Airway Equipment and Method: Stylet and LTA kit utilized Placement Confirmation: ETT inserted through vocal cords under direct vision,  positive ETCO2 and breath sounds checked- equal and bilateral Secured at: 21 cm Tube secured with: Tape Dental Injury: Teeth and Oropharynx as per pre-operative assessment

## 2012-09-16 NOTE — Progress Notes (Signed)
[facility] Progress Note  Subjective: Patient complaining of headache but minimal abdominal pain  Objective: Vital signs in last 24 hours: Temp:  [97.6 F (36.4 C)-98.4 F (36.9 C)] 98.4 F (36.9 C) (07/03 0524) Pulse Rate:  [78-93] 78 (07/03 0524) Resp:  [11-18] 16 (07/03 0524) BP: (112-130)/(65-89) 130/89 mmHg (07/03 0524) SpO2:  [91 %-98 %] 93 % (07/03 0524) Weight change:    PE: Abdomen mildly distended and tender Lab Results: Results for orders placed during the hospital encounter of 09/13/12 (from the past 24 hour(s))  COMPREHENSIVE METABOLIC PANEL     Status: Abnormal   Collection Time    09/16/12  5:31 AM      Result Value Range   Sodium 137  135 - 145 mEq/L   Potassium 3.9  3.5 - 5.1 mEq/L   Chloride 106  96 - 112 mEq/L   CO2 20  19 - 32 mEq/L   Glucose, Bld 90  70 - 99 mg/dL   BUN 12  6 - 23 mg/dL   Creatinine, Ser 1.61  0.50 - 1.10 mg/dL   Calcium 8.4  8.4 - 09.6 mg/dL   Total Protein 6.1  6.0 - 8.3 g/dL   Albumin 2.6 (*) 3.5 - 5.2 g/dL   AST 045 (*) 0 - 37 U/L   ALT 463 (*) 0 - 35 U/L   Alkaline Phosphatase 133 (*) 39 - 117 U/L   Total Bilirubin 3.9 (*) 0.3 - 1.2 mg/dL   GFR calc non Af Amer >90  >90 mL/min   GFR calc Af Amer >90  >90 mL/min  CBC     Status: Abnormal   Collection Time    09/16/12  5:31 AM      Result Value Range   WBC 8.6  4.0 - 10.5 K/uL   RBC 3.66 (*) 3.87 - 5.11 MIL/uL   Hemoglobin 11.1 (*) 12.0 - 15.0 g/dL   HCT 40.9 (*) 81.1 - 91.4 %   MCV 93.4  78.0 - 100.0 fL   MCH 30.3  26.0 - 34.0 pg   MCHC 32.5  30.0 - 36.0 g/dL   RDW 78.2  95.6 - 21.3 %   Platelets 97 (*) 150 - 400 K/uL    Studies/Results: Mr 3d Recon At Scanner  09/14/2012   *RADIOLOGY REPORT*  Clinical Data:  Right upper quadrant pain, nausea and vomiting. Dilated common bile duct.  MRI ABDOMEN WITHOUT AND WITH CONTRAST (INCLUDING MRCP)  Technique:  Multiplanar multisequence MR imaging of the abdomen was performed both before and after the administration of  intravenous contrast. Heavily T2-weighted images of the biliary and pancreatic ducts were obtained, and three-dimensional MRCP images were rendered by post processing.  Contrast: 20mL MULTIHANCE GADOBENATE DIMEGLUMINE 529 MG/ML IV SOLN  Comparison:  Ultrasound of the abdomen dated 09/12/2012.  Comment:  The examination is suboptimal related to extensive patient respiratory motion.  Findings:  MRCP images demonstrate mild to moderate intrahepatic biliary ductal dilatation.  The bile ducts do not have a beaded appearance.  The common bile duct is also dilated measuring up to 13 mm in the porta hepatis.  Gallbladder is moderately distended. No gallbladder wall thickening or pericholecystic fluid.  Notably, there are no signal voids within the gallbladder or the common bile duct to suggest the presence of cholelithiasis or choledocholithiasis.  The common bile duct abruptly tapers to a normal caliber distally at the level of the ampulla.  This tapering appears to be rather abrupt, and there is some  soft tissue fullness at the level of the ampulla where there appears to be a small amount of soft tissue which projects into the lumen of the duodenum (best demonstrated on images 26 of series 4 and 5).  Post gadolinium images demonstrate some progressive enhancement in this region (best demonstrated on image 74 of series 1804), suspicious for a small ampullary lesion.  This is difficult to discretely measure on today's MR images secondary to motion, but a suspected to be approximately 1.5 cm in size or less. The dorsal pancreatic duct is normal in caliber, and there appears to be pancreatic divisum anatomy.  This is best demonstrated on images 22 - 24 of series 4, but is poorly evaluated on other pulse sequences secondary to motion. The ventral pancreatic duct is also normal in caliber.  In the region of the uncinate process of the pancreas there is a tiny 8 x 5 mm lesion that is low signal intensity on T1- weighted images,  high signal intensity on T2-weighted images, and does not appear to enhance.  No peripancreatic lymphadenopathy is noted.  No focal hepatic lesions are identified.  Diffuse loss of signal intensity throughout the hepatic parenchyma on the out of phase dual echo images, compatible with severe hepatic steatosis. The visualized portions of the spleen, bilateral adrenal glands and bilateral kidneys are unremarkable.  IMPRESSION:  1.  Mild to moderate intrahepatic biliary ductal dilatation, and dilatation of the common bile duct which abruptly tapers at the level of the ampulla.  It is uncertain whether not this is simply related to a distal common bile duct stricture, or is related to an underlying ampullary neoplasm.  Further evaluation with endoscopic ultrasound is recommended at this time. 2.  There appears to be pancreatic divisum anatomy, as discussed above. 3.  No evidence of cholelithiasis or choledocholithiasis on today's examination. 4.  8 x 5 mm cystic lesion in the uncinate process of the pancreas is nonspecific and warrants attention on follow-up studies.  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  A 1-year follow-up MRI of the abdomen with contrast is recommended. 5.  Severe hepatic steatosis.   Original Report Authenticated By: Trudie Reed, M.D.   Mr Abd W/wo Cm/mrcp  09/14/2012   *RADIOLOGY REPORT*  Clinical Data:  Right upper quadrant pain, nausea and vomiting. Dilated common bile duct.  MRI ABDOMEN WITHOUT AND WITH CONTRAST (INCLUDING MRCP)  Technique:  Multiplanar multisequence MR imaging of the abdomen was performed both before and after the administration of intravenous contrast. Heavily T2-weighted images of the biliary and pancreatic ducts were obtained, and three-dimensional MRCP images were rendered by post processing.  Contrast: 20mL MULTIHANCE GADOBENATE DIMEGLUMINE 529 MG/ML IV SOLN  Comparison:  Ultrasound of the  abdomen dated 09/12/2012.  Comment:  The examination is suboptimal related to extensive patient respiratory motion.  Findings:  MRCP images demonstrate mild to moderate intrahepatic biliary ductal dilatation.  The bile ducts do not have a beaded appearance.  The common bile duct is also dilated measuring up to 13 mm in the porta hepatis.  Gallbladder is moderately distended. No gallbladder wall thickening or pericholecystic fluid.  Notably, there are no signal voids within the gallbladder or the common bile duct to suggest the presence of cholelithiasis or choledocholithiasis.  The common bile duct abruptly tapers to a normal caliber distally at the level of the ampulla.  This tapering appears to be rather abrupt, and there is some soft  tissue fullness at the level of the ampulla where there appears to be a small amount of soft tissue which projects into the lumen of the duodenum (best demonstrated on images 26 of series 4 and 5).  Post gadolinium images demonstrate some progressive enhancement in this region (best demonstrated on image 74 of series 1804), suspicious for a small ampullary lesion.  This is difficult to discretely measure on today's MR images secondary to motion, but a suspected to be approximately 1.5 cm in size or less. The dorsal pancreatic duct is normal in caliber, and there appears to be pancreatic divisum anatomy.  This is best demonstrated on images 22 - 24 of series 4, but is poorly evaluated on other pulse sequences secondary to motion. The ventral pancreatic duct is also normal in caliber.  In the region of the uncinate process of the pancreas there is a tiny 8 x 5 mm lesion that is low signal intensity on T1- weighted images, high signal intensity on T2-weighted images, and does not appear to enhance.  No peripancreatic lymphadenopathy is noted.  No focal hepatic lesions are identified.  Diffuse loss of signal intensity throughout the hepatic parenchyma on the out of phase dual echo  images, compatible with severe hepatic steatosis. The visualized portions of the spleen, bilateral adrenal glands and bilateral kidneys are unremarkable.  IMPRESSION:  1.  Mild to moderate intrahepatic biliary ductal dilatation, and dilatation of the common bile duct which abruptly tapers at the level of the ampulla.  It is uncertain whether not this is simply related to a distal common bile duct stricture, or is related to an underlying ampullary neoplasm.  Further evaluation with endoscopic ultrasound is recommended at this time. 2.  There appears to be pancreatic divisum anatomy, as discussed above. 3.  No evidence of cholelithiasis or choledocholithiasis on today's examination. 4.  8 x 5 mm cystic lesion in the uncinate process of the pancreas is nonspecific and warrants attention on follow-up studies.  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  A 1-year follow-up MRI of the abdomen with contrast is recommended. 5.  Severe hepatic steatosis.   Original Report Authenticated By: Trudie Reed, M.D.      Assessment: Dilated CBD with elevated liver function tests in colicky-like pain. Exact nature of present obstruction not clear by ultrasound, MRCP, or EUS. Presumably at or near the papilla. 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  Plan: ERCP to be performed with probable sphincterotomy and possible stent placement later this afternoon. Risks rationale and alternatives were thoroughly explained to the patient and family, as well as uncertainty as to the exact diagnosis at this point. She wishes to proceed. We'll schedule for later this afternoon.    Mikylah Ackroyd C 09/16/2012, 7:32 AM

## 2012-09-16 NOTE — Progress Notes (Signed)
Pt correctly positioned on ERCP table and all pressure points padded with gel rolls, pillows, and foam for protection. Sheryl Turner, Sheryl Turner

## 2012-09-16 NOTE — Anesthesia Postprocedure Evaluation (Signed)
  Anesthesia Post-op Note  Patient: Sheryl Turner  Procedure(s) Performed: Procedure(s): ENDOSCOPIC RETROGRADE CHOLANGIOPANCREATOGRAPHY (ERCP) (N/A)  Patient Location: PACU  Anesthesia Type:General  Level of Consciousness: awake, alert  and oriented  Airway and Oxygen Therapy: Patient Spontanous Breathing and Patient connected to nasal cannula oxygen  Post-op Pain: none  Post-op Assessment: Post-op Vital signs reviewed  Post-op Vital Signs: Reviewed  Complications: No apparent anesthesia complications

## 2012-09-16 NOTE — Progress Notes (Signed)
Sheryl Turner 1:46 PM  Subjective: The patient does not feel well but her pain is not as bad and her case was discussed with my partner Dr. Madilyn Fireman and the hospital computer was reviewed  Objective: Vital signs stable afebrile no acute distress physical exam please see pre-assessment evaluation labs MRI and ultrasound and EUS reviewed  Assessment: Distal CBD obstruction questionable etiology  Plan: The risks benefits methods and success rate of ERCP was rediscussed with the patient and will proceed later today with further workup and plans pending those findings  [facility]

## 2012-09-16 NOTE — Preoperative (Signed)
Beta Blockers   Reason not to administer Beta Blockers:Not Applicable 

## 2012-09-16 NOTE — Transfer of Care (Signed)
Immediate Anesthesia Transfer of Care Note  Patient: Sheryl Turner  Procedure(s) Performed: Procedure(s): ENDOSCOPIC RETROGRADE CHOLANGIOPANCREATOGRAPHY (ERCP) (N/A)  Patient Location: PACU  Anesthesia Type:General  Level of Consciousness: awake, alert  and oriented  Airway & Oxygen Therapy: Patient Spontanous Breathing and Patient connected to nasal cannula oxygen  Post-op Assessment: Report given to PACU RN and Post -op Vital signs reviewed and stable  Post vital signs: Reviewed and stable  Complications: No apparent anesthesia complications

## 2012-09-16 NOTE — Op Note (Signed)
[facility] [address] [place], [address]   ERCP PROCEDURE REPORT  PATIENT: Sheryl Turner, Sheryl Turner  MR# :[id number] BIRTHDATE: 1956/02/14  GENDER: Female ENDOSCOPIST: Vida Rigger, MD REFERRED BY: PROCEDURE DATE:  09/16/2012 PROCEDURE:   ERCP with sphincterotomy/papillotomy, ERCP with brushing, ERCP with removal of calculus/calculi , and ERCP with stent placement ASA CLASS:    2 INDICATIONS: CBD obstruction MEDICATIONS:    general anesthesia TOPICAL ANESTHETIC:  no  DESCRIPTION OF PROCEDURE:   After the risks benefits and alternatives of the procedure were thoroughly explained, informed consent was obtained.  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 was brought into view and using the triple-lumen sphincterotome loaded with the JAG Jagwire deep selective cannulation was obtained on the first attempt and there was no pancreatic duct injection or wire advancement throughout the procedureand dye was injected into the CBDwhich was not well filled but the intrahepatics and proximal duct was dilated and we proceeded with a moderately large sphincterotomy in the customary fashion until we were able to get the fully bowed sphincterotome easily in and out of the duct and then we proceeded with an occlusion cholangiogram using first the adjustable balloon 12-15 mm inflated to 15 and injected below and we thought she had a a short distal stricture which was difficult to delineate and balloon pull-through was difficult but we were able to pull the balloon through multiple times with some resistance and the wire fell out each time however we were easily able to recannulateAnd we never popped the balloon and a little debris and sludge and some white material was removed we then went ahead and brushed the distal duct in the customary  and then change to the adjustable balloon with ejection above and again try to delineate her stricture but could not get good pictures and after our efforts we elected to place a 10 French 5 cm stent in the customary fashion with adequate dye drainage and seemingly in the proper position under fluoroscopy and endoscopically          COMPLICATIONS: none  ENDOSCOPIC IMPRESSION:1. 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 No PD injections or wire advancement4 balloon pull-through as above with minimal infection and debris removed  RECOMMENDATIONS:observe for delayed complications await pathology and then decide on surgical options CT or a followup spygllass cholangioscopy   _______________________________ eSigned:  Vida Rigger, MD 09/16/2012 3:25 PM   CC:

## 2012-09-16 NOTE — Progress Notes (Signed)
PATIENT DETAILS Name: Sheryl Turner Age: 57 y.o. Sex: female Date of Birth: 11-26-1955 Admit Date: 09/13/2012 Admitting Physician Clydia Llano, MD ZOX:[id number], MD  Subjective: Mild headache and still with abdominal pain today.  Assessment/Plan: Principal Problem:  Abdominal pain with CBD obstruction - Not exactly sure what the exact nature of the obstruction is, however EUS and acute onset of symptoms are suggestive of gallstones/biliary sludge etiology.Although, has CBD obstruction, LFTs, consistent with a hepatocellular jaundice. - ERCP scheduled for 7/3 with stent placement - Would likely need a cholecystectomy at some point during this admission. However we will see how she does with ERCP first. - Continue with supportive care-IV fluids and IV ciprofloxacin.  Active Problems: Thrombocytopenia - Stop prophylactic Lovenox, send out a HIT panel. Could be do to acute illness as well - Check vitamin B12 level as well  History of hypothyroidism - Currently on IV levothyroxine-will transition back to oral dosing, once oral intake resumed  Disposition: Remain inpatient  DVT Prophylaxis:  SCD's- given dropping platelet count  Code Status: Full code   Family Communication None at bedside this am  Procedures:  None  CONSULTS:  GI   MEDICATIONS: Scheduled Meds: . [facility] HOLD] ciprofloxacin  400 mg Intravenous Q12H  . [facility] HOLD] levothyroxine  50 mcg Intravenous QAC breakfast  . [[facility] HOLD] LORazepam  0.5 mg Intravenous Once  . [[facility] HOLD] ondansetron  4 mg Oral Once  . [[facility] HOLD] pantoprazole (PROTONIX) IV  40 mg Intravenous Q24H  . [facility] HOLD] sodium chloride  3 mL Intravenous Q12H   Continuous Infusions: . lactated ringers 50 mL/hr at 09/16/12 1239  . sodium chloride 0.9 % 1,000 mL with potassium chloride 20 mEq infusion 125 mL/hr at 09/16/12 0837   PRN Meds:.HYDROmorphone (DILAUDID) injection, [[facility] HOLD]  HYDROmorphone (DILAUDID) injection, [[facility] HOLD]  ibuprofen, [[facility] HOLD] LORazepam, meperidine (DEMEROL) injection, [[facility] HOLD] ondansetron (ZOFRAN) IV, [[facility] HOLD] ondansetron, [[facility] HOLD] promethazine, promethazine  Antibiotics: Anti-infectives   Start     Dose/Rate Route Frequency Ordered Stop   09/13/12 1800  [[facility] Hold]  ciprofloxacin (CIPRO) IVPB 400 mg     (On [facility] Hold since 09/16/12 1217)   400 mg 200 mL/hr over 60 Minutes Intravenous Every 12 hours 09/13/12 1732         PHYSICAL EXAM: Vital signs in last 24 hours: Filed Vitals:   09/15/12 1500 09/15/12 1551 09/15/12 2118 09/16/12 0524  BP: 121/83 128/70 114/77 130/89  Pulse:  85 93 78  Temp:  97.9 F (36.6 C) 98.3 F (36.8 C) 98.4 F (36.9 C)  TempSrc:  Oral Oral Oral  Resp: 18 18 18 16   Height:      Weight:      SpO2: 93% 98% 93% 93%    Weight change:  Filed Weights   09/13/12 1700  Weight: 95.936 kg (211 lb 8 oz)   Body mass index is 36.29 kg/(m^2).   Gen Exam: Awake and alert with clear speech.   Neck: Supple, No JVD.   Chest: B/L Clear.   CVS: S1 S2 Regular, no murmurs.  Abdomen: soft, BS +, mildly tender in the RUQ area, non distended.  Extremities: no edema, lower extremities warm to touch. Neurologic: Non Focal.   Skin: No Rash.   Wounds: N/A.   Intake/Output from previous day:  Intake/Output Summary (Last 24 hours) at 09/16/12 1448 Last data filed at 09/16/12 0609  Gross per 24 hour  Intake 3807.09 ml  Output      0 ml  Net 3807.09 ml     LAB RESULTS: CBC  Recent Labs Lab 09/12/12 1630 09/13/12 1146 09/14/12 0631 09/14/12 0845 09/15/12 0653 09/16/12 0531  WBC 6.7 10.0 10.8* 3.5* 9.9 8.6  HGB 13.5 13.7 12.0 11.5* 10.6* 11.1*  HCT 40.3 40.9 36.7 35.0* 32.4* 34.2*  PLT 234 215 176 144* 99* 97*  MCV 92.6 91.9 93.9 93.1 93.4 93.4  MCH 31.0 30.8 30.7 30.6 30.5 30.3  MCHC 33.5 33.5 32.7 32.9 32.7 32.5  RDW 13.8 13.8 14.5 14.4 15.1 15.2  LYMPHSABS 2.5 0.5*  --   --   --   --   MONOABS 0.4 0.2  --   --   --   --   EOSABS 0.1 0.0  --    --   --   --   BASOSABS 0.1 0.0  --   --   --   --     Chemistries   Recent Labs Lab 09/13/12 1146 09/14/12 0631 09/14/12 0845 09/15/12 0653 09/16/12 0531  NA 135 138 136 137 137  K 3.8 3.7 3.3* 3.8 3.9  CL 98 104 103 105 106  CO2 21 23 21 20 20   GLUCOSE 136* 111* 87 117* 90  BUN 11 14 15 22 12   CREATININE 0.73 1.01 0.99 1.00 0.67  CALCIUM 9.2 8.1* 8.0* 7.6* 8.4    CBG: No results found for this basename: GLUCAP,  in the last 168 hours  GFR Estimated Creatinine Clearance: 89.3 ml/min (by C-G formula based on Cr of 0.67).  Coagulation profile No results found for this basename: INR, PROTIME,  in the last 168 hours  Cardiac Enzymes  Recent Labs Lab 09/12/12 1637  TROPONINI <0.30    No components found with this basename: POCBNP,  No results found for this basename: DDIMER,  in the last 72 hours No results found for this basename: HGBA1C,  in the last 72 hours No results found for this basename: CHOL, HDL, LDLCALC, TRIG, CHOLHDL, LDLDIRECT,  in the last 72 hours No results found for this basename: TSH, T4TOTAL, FREET3, T3FREE, THYROIDAB,  in the last 72 hours No results found for this basename: VITAMINB12, FOLATE, FERRITIN, TIBC, IRON, RETICCTPCT,  in the last 72 hours  Recent Labs  09/14/12 0845 09/15/12 0653  LIPASE 25 12  AMYLASE 29  --     Urine Studies No results found for this basename: UACOL, UAPR, USPG, UPH, UTP, UGL, UKET, UBIL, UHGB, UNIT, UROB, ULEU, UEPI, UWBC, URBC, UBAC, CAST, CRYS, UCOM, BILUA,  in the last 72 hours  MICROBIOLOGY: Recent Results (from the past 240 hour(s))  CULTURE, BLOOD (ROUTINE X 2)     Status: None   Collection Time    09/13/12  8:00 PM      Result Value Range Status   Specimen Description BLOOD ARM LEFT   Final   Special Requests BOTTLES DRAWN AEROBIC ONLY 5CC   Final   Culture  Setup Time 09/14/2012 03:48   Final   Culture     Final   Value:        BLOOD CULTURE RECEIVED NO GROWTH TO DATE CULTURE WILL BE HELD FOR 5  DAYS BEFORE ISSUING A FINAL NEGATIVE REPORT   Report Status PENDING   Incomplete  CULTURE, BLOOD (ROUTINE X 2)     Status: None   Collection Time    09/13/12  8:15 PM      Result Value Range Status   Specimen Description BLOOD HAND LEFT   Final  Special Requests BOTTLES DRAWN AEROBIC ONLY 5CC   Final   Culture  Setup Time 09/14/2012 03:29   Final   Culture     Final   Value:        BLOOD CULTURE RECEIVED NO GROWTH TO DATE CULTURE WILL BE HELD FOR 5 DAYS BEFORE ISSUING A FINAL NEGATIVE REPORT   Report Status PENDING   Incomplete    RADIOLOGY STUDIES/RESULTS: Dg Chest 2 View  09/12/2012   *RADIOLOGY REPORT*  Clinical Data: Upper abdominal pain.  Shortness of breath.  CHEST - 2 VIEW  Comparison: 06/28/2004.  Findings: The heart, mediastinal and hilar contours are normal. An azygos lobe is incidentally noted.  A small, calcified density described as projecting over the anterior  right first rib on the preceding study is again identified and is unchanged.  The lungs are clear.  There are no effusions or pneumothoraces.  There are no acute bony changes.  IMPRESSION: No active disease.   Original Report Authenticated By: Sander Radon, M.D.   US Abdomen Complete  09/12/2012   *RADIOLOGY REPORT*  Clinical Data:  Mid to right upper quadrant abdominal pain for 3 hours, extending into the back.  History of appendectomy and hysterectomy. Mildly elevated lipase levels.  COMPLETE ABDOMINAL ULTRASOUND  Comparison:  Abdominal pelvic CT 06/29/1994.  Findings:  Gallbladder: Well distended without wall thickening, stones or pericholecystic fluid. Negative sonographic Murphy's sign.  Common bile duct:   The common bile duct is dilated to 13 mm maximally.  The distal duct is not well visualized.  No intraductal calculi are seen.  Liver:  There is increased hepatic echogenicity most consistent with steatosis.  No focal liver lesions are identified.  IVC:  Visualized portions appear unremarkable.  Pancreas:  The  pancreatic duct is at the upper limits of normal in caliber, measuring 4 mm.  No focal pancreatic abnormality is seen.  Spleen:  Visualized portions appear unremarkable.  Right Kidney:   The renal cortical thickness and echogenicity are preserved.  There is no hydronephrosis or focal abnormality. Renal length is 11.0 cm.  Left Kidney:   The renal cortical thickness and echogenicity are preserved.  There is no hydronephrosis or focal abnormality. Renal length is 11.0 cm.  Abdominal aorta:  Visualized portions appear unremarkable.  The distal aorta is partly obscured by bowel gas.  IMPRESSION:  1.  Extrahepatic biliary dilatation may be secondary to pancreatitis or non visualized choledocholithiasis. 2.  Increased hepatic echogenicity, most consistent with steatosis. 3.  No evidence of cholelithiasis or cholecystitis.   Original Report Authenticated By: Carey Bullocks, M.D.   Mr 3d Recon At Scanner  09/14/2012   *RADIOLOGY REPORT*  Clinical Data:  Right upper quadrant pain, nausea and vomiting. Dilated common bile duct.  MRI ABDOMEN WITHOUT AND WITH CONTRAST (INCLUDING MRCP)  Technique:  Multiplanar multisequence MR imaging of the abdomen was performed both before and after the administration of intravenous contrast. Heavily T2-weighted images of the biliary and pancreatic ducts were obtained, and three-dimensional MRCP images were rendered by post processing.  Contrast: 20mL MULTIHANCE GADOBENATE DIMEGLUMINE 529 MG/ML IV SOLN  Comparison:  Ultrasound of the abdomen dated 09/12/2012.  Comment:  The examination is suboptimal related to extensive patient respiratory motion.  Findings:  MRCP images demonstrate mild to moderate intrahepatic biliary ductal dilatation.  The bile ducts do not have a beaded appearance.  The common bile duct is also dilated measuring up to 13 mm in the porta hepatis.  Gallbladder is moderately distended. No gallbladder  wall thickening or pericholecystic fluid.  Notably, there are no signal  voids within the gallbladder or the common bile duct to suggest the presence of cholelithiasis or choledocholithiasis.  The common bile duct abruptly tapers to a normal caliber distally at the level of the ampulla.  This tapering appears to be rather abrupt, and there is some soft tissue fullness at the level of the ampulla where there appears to be a small amount of soft tissue which projects into the lumen of the duodenum (best demonstrated on images 26 of series 4 and 5).  Post gadolinium images demonstrate some progressive enhancement in this region (best demonstrated on image 74 of series 1804), suspicious for a small ampullary lesion.  This is difficult to discretely measure on today's MR images secondary to motion, but a suspected to be approximately 1.5 cm in size or less. The dorsal pancreatic duct is normal in caliber, and there appears to be pancreatic divisum anatomy.  This is best demonstrated on images 22 - 24 of series 4, but is poorly evaluated on other pulse sequences secondary to motion. The ventral pancreatic duct is also normal in caliber.  In the region of the uncinate process of the pancreas there is a tiny 8 x 5 mm lesion that is low signal intensity on T1- weighted images, high signal intensity on T2-weighted images, and does not appear to enhance.  No peripancreatic lymphadenopathy is noted.  No focal hepatic lesions are identified.  Diffuse loss of signal intensity throughout the hepatic parenchyma on the out of phase dual echo images, compatible with severe hepatic steatosis. The visualized portions of the spleen, bilateral adrenal glands and bilateral kidneys are unremarkable.  IMPRESSION:  1.  Mild to moderate intrahepatic biliary ductal dilatation, and dilatation of the common bile duct which abruptly tapers at the level of the ampulla.  It is uncertain whether not this is simply related to a distal common bile duct stricture, or is related to an underlying ampullary neoplasm.  Further  evaluation with endoscopic ultrasound is recommended at this time. 2.  There appears to be pancreatic divisum anatomy, as discussed above. 3.  No evidence of cholelithiasis or choledocholithiasis on today's examination. 4.  8 x 5 mm cystic lesion in the uncinate process of the pancreas is nonspecific and warrants attention on follow-up studies.  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  A 1-year follow-up MRI of the abdomen with contrast is recommended. 5.  Severe hepatic steatosis.   Original Report Authenticated By: Trudie Reed, M.D.   Mr Abd W/wo Cm/mrcp  09/14/2012   *RADIOLOGY REPORT*  Clinical Data:  Right upper quadrant pain, nausea and vomiting. Dilated common bile duct.  MRI ABDOMEN WITHOUT AND WITH CONTRAST (INCLUDING MRCP)  Technique:  Multiplanar multisequence MR imaging of the abdomen was performed both before and after the administration of intravenous contrast. Heavily T2-weighted images of the biliary and pancreatic ducts were obtained, and three-dimensional MRCP images were rendered by post processing.  Contrast: 20mL MULTIHANCE GADOBENATE DIMEGLUMINE 529 MG/ML IV SOLN  Comparison:  Ultrasound of the abdomen dated 09/12/2012.  Comment:  The examination is suboptimal related to extensive patient respiratory motion.  Findings:  MRCP images demonstrate mild to moderate intrahepatic biliary ductal dilatation.  The bile ducts do not have a beaded appearance.  The common bile duct is also dilated measuring up to 13 mm in the porta hepatis.  Gallbladder is moderately distended. No gallbladder wall  thickening or pericholecystic fluid.  Notably, there are no signal voids within the gallbladder or the common bile duct to suggest the presence of cholelithiasis or choledocholithiasis.  The common bile duct abruptly tapers to a normal caliber distally at the level of the ampulla.  This tapering appears to be rather abrupt, and there is  some soft tissue fullness at the level of the ampulla where there appears to be a small amount of soft tissue which projects into the lumen of the duodenum (best demonstrated on images 26 of series 4 and 5).  Post gadolinium images demonstrate some progressive enhancement in this region (best demonstrated on image 74 of series 1804), suspicious for a small ampullary lesion.  This is difficult to discretely measure on today's MR images secondary to motion, but a suspected to be approximately 1.5 cm in size or less. The dorsal pancreatic duct is normal in caliber, and there appears to be pancreatic divisum anatomy.  This is best demonstrated on images 22 - 24 of series 4, but is poorly evaluated on other pulse sequences secondary to motion. The ventral pancreatic duct is also normal in caliber.  In the region of the uncinate process of the pancreas there is a tiny 8 x 5 mm lesion that is low signal intensity on T1- weighted images, high signal intensity on T2-weighted images, and does not appear to enhance.  No peripancreatic lymphadenopathy is noted.  No focal hepatic lesions are identified.  Diffuse loss of signal intensity throughout the hepatic parenchyma on the out of phase dual echo images, compatible with severe hepatic steatosis. The visualized portions of the spleen, bilateral adrenal glands and bilateral kidneys are unremarkable.  IMPRESSION:  1.  Mild to moderate intrahepatic biliary ductal dilatation, and dilatation of the common bile duct which abruptly tapers at the level of the ampulla.  It is uncertain whether not this is simply related to a distal common bile duct stricture, or is related to an underlying ampullary neoplasm.  Further evaluation with endoscopic ultrasound is recommended at this time. 2.  There appears to be pancreatic divisum anatomy, as discussed above. 3.  No evidence of cholelithiasis or choledocholithiasis on today's examination. 4.  8 x 5 mm cystic lesion in the uncinate process  of the pancreas is nonspecific and warrants attention on follow-up studies.  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  A 1-year follow-up MRI of the abdomen with contrast is recommended. 5.  Severe hepatic steatosis.   Original Report Authenticated By: Trudie Reed, M.D.    Jeoffrey Massed, MD  [facility] Pager:[phone]  If 7PM-7AM, please contact night-coverage [email or web address] Password [email or web address] 09/16/2012, 2:48 PM   LOS: 3 days

## 2012-09-16 NOTE — Anesthesia Preprocedure Evaluation (Signed)
Anesthesia Evaluation  Patient identified by MRN, date of birth, ID band Patient awake    Reviewed: Allergy & Precautions, H&P , NPO status , Patient's Chart, lab work & pertinent test results  Airway Mallampati: II  Neck ROM: full    Dental   Pulmonary shortness of breath,          Cardiovascular     Neuro/Psych    GI/Hepatic hiatal hernia, GERD-  ,pancreatitis   Endo/Other  Hypothyroidism obese  Renal/GU      Musculoskeletal   Abdominal   Peds  Hematology   Anesthesia Other Findings   Reproductive/Obstetrics                           Anesthesia Physical Anesthesia Plan  ASA: II  Anesthesia Plan: General   Post-op Pain Management:    Induction: Intravenous  Airway Management Planned: Oral ETT  Additional Equipment:   Intra-op Plan:   Post-operative Plan: Extubation in OR  Informed Consent: I have reviewed the patients History and Physical, chart, labs and discussed the procedure including the risks, benefits and alternatives for the proposed anesthesia with the patient or authorized representative who has indicated his/her understanding and acceptance.     Plan Discussed with: CRNA, Anesthesiologist and Surgeon  Anesthesia Plan Comments:         Anesthesia Quick Evaluation

## 2012-09-16 NOTE — Progress Notes (Signed)
Pt complained of multiple wavy, colored lines across her field of vision that last 1 minute when she first opens her eyes after sleeping. She says this has happened two other times but has not mentioned it before. Will notify MD. Peter Congo RN

## 2012-09-17 ENCOUNTER — Encounter (HOSPITAL_COMMUNITY): Payer: Self-pay | Admitting: Gastroenterology

## 2012-09-17 LAB — VITAMIN B12: Vitamin B-12: 923 pg/mL — ABNORMAL HIGH (ref 211–911)

## 2012-09-17 LAB — HEPATIC FUNCTION PANEL
Albumin: 2.4 g/dL — ABNORMAL LOW (ref 3.5–5.2)
Alkaline Phosphatase: 149 U/L — ABNORMAL HIGH (ref 39–117)
Bilirubin, Direct: 1.4 mg/dL — ABNORMAL HIGH (ref 0.0–0.3)
Total Bilirubin: 2.3 mg/dL — ABNORMAL HIGH (ref 0.3–1.2)

## 2012-09-17 LAB — BASIC METABOLIC PANEL
Chloride: 104 mEq/L (ref 96–112)
Creatinine, Ser: 0.62 mg/dL (ref 0.50–1.10)
GFR calc Af Amer: >90 mL/min (ref >90)
GFR calc non Af Amer: >90 mL/min (ref >90)
Potassium: 3.7 mEq/L (ref 3.5–5.1)

## 2012-09-17 LAB — CBC WITH DIFFERENTIAL/PLATELET
Basophils Absolute: 0 10*3/uL (ref 0.0–0.1)
Basophils Relative: 0 % (ref 0–1)
MCHC: 33.3 g/dL (ref 30.0–36.0)
Neutro Abs: 5.7 10*3/uL (ref 1.7–7.7)
Neutrophils Relative %: 75 % (ref 43–77)
RDW: 14.9 % (ref 11.5–15.5)

## 2012-09-17 MED ORDER — ONDANSETRON HCL 4 MG/2ML IJ SOLN
4.0000 mg | Freq: Four times a day (QID) | INTRAMUSCULAR | Status: AC | PRN
  Administered 2012-09-17: 4 mg via INTRAVENOUS
  Filled 2012-09-17: qty 2

## 2012-09-17 MED ORDER — OXYCODONE HCL 5 MG PO TABS
5.0000 mg | ORAL_TABLET | Freq: Once | ORAL | Status: AC | PRN
  Administered 2012-09-17: 5 mg via ORAL
  Filled 2012-09-17: qty 1

## 2012-09-17 MED ORDER — OXYCODONE HCL 5 MG/5ML PO SOLN: 5.0000 mg | Freq: Once | ORAL | Status: AC | PRN

## 2012-09-17 MED ORDER — FENTANYL CITRATE 0.05 MG/ML IJ SOLN: 25.0000 ug | INTRAMUSCULAR | Status: DC | PRN

## 2012-09-17 NOTE — Progress Notes (Signed)
PATIENT DETAILS Name: Sheryl Turner Age: 57 y.o. Sex: female Date of Birth: 12-15-1955 Admit Date: 09/13/2012 Admitting Physician Clydia Llano, MD ZOX:WRUE,[id number] L, MD  Subjective: Better today-some abdominal discomfort and nausea still persists-tolerating clears to some extent  Assessment/Plan: Principal Problem:  Abdominal pain with CBD obstruction - Not exactly sure what the exact nature of the obstruction is, however EUS and acute onset of symptoms are suggestive of gallstones/biliary sludge etiology.Although, has CBD obstruction, LFTs,are not entirely consistent with a hepatocellular jaundice. - ERCP with stent placement and brushings done on 7/3 - ?Would likely need a cholecystectomy at some point during this admission. Spoke with [name], he suggested to advance diet and see how patient does first-may need further evaluation only if cannot advance diet - Continue with supportive care-IV fluids and IV ciprofloxacin.  Active Problems: Thrombocytopenia - Stopped prophylactic Lovenox on 7/3,  HIT pane lpending. Could be do to acute illness as well - Check vitamin B12 pending -platelets slightly up today-continue to monitor  History of hypothyroidism - Currently on IV levothyroxine-will transition back to oral dosing, once oral intake resumed  Disposition: Remain inpatient  DVT Prophylaxis:  SCD's- given dropping platelet count  Code Status: Full code   Family Communication None at bedside this am  Procedures:  None  CONSULTS:  GI   MEDICATIONS: Scheduled Meds: . ciprofloxacin  400 mg Intravenous Q12H  . levothyroxine  50 mcg Intravenous QAC breakfast  . LORazepam  0.5 mg Intravenous Once  . ondansetron  4 mg Oral Once  . pantoprazole (PROTONIX) IV  40 mg Intravenous Q24H  . sodium chloride  3 mL Intravenous Q12H   Continuous Infusions: . lactated ringers 50 mL/hr at 09/16/12 1239  . sodium chloride 0.9 % 1,000 mL with potassium chloride 20 mEq infusion  125 mL/hr at 09/17/12 0639   PRN Meds:.fentaNYL, HYDROmorphone (DILAUDID) injection, ibuprofen, LORazepam, ondansetron (ZOFRAN) IV, ondansetron (ZOFRAN) IV, ondansetron, oxyCODONE, oxyCODONE, promethazine  Antibiotics: Anti-infectives   Start     Dose/Rate Route Frequency Ordered Stop   09/13/12 1800  ciprofloxacin (CIPRO) IVPB 400 mg     400 mg 200 mL/hr over 60 Minutes Intravenous Every 12 hours 09/13/12 1732         PHYSICAL EXAM: Vital signs in last 24 hours: Filed Vitals:   09/16/12 1551 09/16/12 1602 09/16/12 2122 09/17/12 0516  BP:  154/87 146/88 152/90  Pulse: 74 75 75 81  Temp: 97.3 F (36.3 C) 98.7 F (37.1 C) 98 F (36.7 C) 97.4 F (36.3 C)  TempSrc:   Oral Oral  Resp: 15 15 16 16   Height:      Weight:      SpO2: 97% 96% 96% 94%    Weight change:  Filed Weights   09/13/12 1700  Weight: 95.936 kg (211 lb 8 oz)   Body mass index is 36.29 kg/(m^2).   Gen Exam: Awake and alert with clear speech.   Neck: Supple, No JVD.   Chest: B/L Clear.   CVS: S1 S2 Regular, no murmurs.  Abdomen: soft, BS +, mildly tender in the RUQ area, non distended.  Extremities: no edema, lower extremities warm to touch. Neurologic: Non Focal.   Skin: No Rash.   Wounds: N/A.   Intake/Output from previous day:  Intake/Output Summary (Last 24 hours) at 09/17/12 1041 Last data filed at 09/17/12 0639  Gross per 24 hour  Intake 4442.5 ml  Output      0 ml  Net 4442.5 ml  LAB RESULTS: CBC  Recent Labs Lab 09/12/12 1630 09/13/12 1146 09/14/12 0631 09/14/12 0845 09/15/12 0653 09/16/12 0531 09/17/12 0735  WBC 6.7 10.0 10.8* 3.5* 9.9 8.6 7.6  HGB 13.5 13.7 12.0 11.5* 10.6* 11.1* 11.5*  HCT 40.3 40.9 36.7 35.0* 32.4* 34.2* 34.5*  PLT 234 215 176 144* 99* 97* 112*  MCV 92.6 91.9 93.9 93.1 93.4 93.4 90.8  MCH 31.0 30.8 30.7 30.6 30.5 30.3 30.3  MCHC 33.5 33.5 32.7 32.9 32.7 32.5 33.3  RDW 13.8 13.8 14.5 14.4 15.1 15.2 14.9  LYMPHSABS 2.5 0.5*  --   --   --   --  1.2   MONOABS 0.4 0.2  --   --   --   --  0.5  EOSABS 0.1 0.0  --   --   --   --  0.1  BASOSABS 0.1 0.0  --   --   --   --  0.0    Chemistries   Recent Labs Lab 09/14/12 0631 09/14/12 0845 09/15/12 0653 09/16/12 0531 09/17/12 0735  NA 138 136 137 137 136  K 3.7 3.3* 3.8 3.9 3.7  CL 104 103 105 106 104  CO2 23 21 20 20 21   GLUCOSE 111* 87 117* 90 111*  BUN 14 15 22 12 8   CREATININE 1.01 0.99 1.00 0.67 0.62  CALCIUM 8.1* 8.0* 7.6* 8.4 8.5    CBG: No results found for this basename: GLUCAP,  in the last 168 hours  GFR Estimated Creatinine Clearance: 89.3 ml/min (by C-G formula based on Cr of 0.62).  Coagulation profile No results found for this basename: INR, PROTIME,  in the last 168 hours  Cardiac Enzymes  Recent Labs Lab 09/12/12 1637  TROPONINI <0.30    No components found with this basename: POCBNP,  No results found for this basename: DDIMER,  in the last 72 hours No results found for this basename: HGBA1C,  in the last 72 hours No results found for this basename: CHOL, HDL, LDLCALC, TRIG, CHOLHDL, LDLDIRECT,  in the last 72 hours No results found for this basename: TSH, T4TOTAL, FREET3, T3FREE, THYROIDAB,  in the last 72 hours No results found for this basename: VITAMINB12, FOLATE, FERRITIN, TIBC, IRON, RETICCTPCT,  in the last 72 hours  Recent Labs  09/15/12 0653  LIPASE 12    Urine Studies No results found for this basename: UACOL, UAPR, USPG, UPH, UTP, UGL, UKET, UBIL, UHGB, UNIT, UROB, ULEU, UEPI, UWBC, URBC, UBAC, CAST, CRYS, UCOM, BILUA,  in the last 72 hours  MICROBIOLOGY: Recent Results (from the past 240 hour(s))  CULTURE, BLOOD (ROUTINE X 2)     Status: None   Collection Time    09/13/12  8:00 PM      Result Value Range Status   Specimen Description BLOOD ARM LEFT   Final   Special Requests BOTTLES DRAWN AEROBIC ONLY 5CC   Final   Culture  Setup Time 09/14/2012 03:48   Final   Culture     Final   Value:        BLOOD CULTURE RECEIVED NO  GROWTH TO DATE CULTURE WILL BE HELD FOR 5 DAYS BEFORE ISSUING A FINAL NEGATIVE REPORT   Report Status PENDING   Incomplete  CULTURE, BLOOD (ROUTINE X 2)     Status: None   Collection Time    09/13/12  8:15 PM      Result Value Range Status   Specimen Description BLOOD HAND LEFT   Final   Special  Requests BOTTLES DRAWN AEROBIC ONLY 5CC   Final   Culture  Setup Time 09/14/2012 03:29   Final   Culture     Final   Value:        BLOOD CULTURE RECEIVED NO GROWTH TO DATE CULTURE WILL BE HELD FOR 5 DAYS BEFORE ISSUING A FINAL NEGATIVE REPORT   Report Status PENDING   Incomplete    RADIOLOGY STUDIES/RESULTS: Dg Chest 2 View  09/12/2012   *RADIOLOGY REPORT*  Clinical Data: Upper abdominal pain.  Shortness of breath.  CHEST - 2 VIEW  Comparison: 06/28/2004.  Findings: The heart, mediastinal and hilar contours are normal. An azygos lobe is incidentally noted.  A small, calcified density described as projecting over the anterior  right first rib on the preceding study is again identified and is unchanged.  The lungs are clear.  There are no effusions or pneumothoraces.  There are no acute bony changes.  IMPRESSION: No active disease.   Original Report Authenticated By: Sander Radon, M.D.   US Abdomen Complete  09/12/2012   *RADIOLOGY REPORT*  Clinical Data:  Mid to right upper quadrant abdominal pain for 3 hours, extending into the back.  History of appendectomy and hysterectomy. Mildly elevated lipase levels.  COMPLETE ABDOMINAL ULTRASOUND  Comparison:  Abdominal pelvic CT 06/29/1994.  Findings:  Gallbladder: Well distended without wall thickening, stones or pericholecystic fluid. Negative sonographic Murphy's sign.  Common bile duct:   The common bile duct is dilated to 13 mm maximally.  The distal duct is not well visualized.  No intraductal calculi are seen.  Liver:  There is increased hepatic echogenicity most consistent with steatosis.  No focal liver lesions are identified.  IVC:  Visualized  portions appear unremarkable.  Pancreas:  The pancreatic duct is at the upper limits of normal in caliber, measuring 4 mm.  No focal pancreatic abnormality is seen.  Spleen:  Visualized portions appear unremarkable.  Right Kidney:   The renal cortical thickness and echogenicity are preserved.  There is no hydronephrosis or focal abnormality. Renal length is 11.0 cm.  Left Kidney:   The renal cortical thickness and echogenicity are preserved.  There is no hydronephrosis or focal abnormality. Renal length is 11.0 cm.  Abdominal aorta:  Visualized portions appear unremarkable.  The distal aorta is partly obscured by bowel gas.  IMPRESSION:  1.  Extrahepatic biliary dilatation may be secondary to pancreatitis or non visualized choledocholithiasis. 2.  Increased hepatic echogenicity, most consistent with steatosis. 3.  No evidence of cholelithiasis or cholecystitis.   Original Report Authenticated By: Carey Bullocks, M.D.   Mr 3d Recon At Scanner  09/14/2012   *RADIOLOGY REPORT*  Clinical Data:  Right upper quadrant pain, nausea and vomiting. Dilated common bile duct.  MRI ABDOMEN WITHOUT AND WITH CONTRAST (INCLUDING MRCP)  Technique:  Multiplanar multisequence MR imaging of the abdomen was performed both before and after the administration of intravenous contrast. Heavily T2-weighted images of the biliary and pancreatic ducts were obtained, and three-dimensional MRCP images were rendered by post processing.  Contrast: 20mL MULTIHANCE GADOBENATE DIMEGLUMINE 529 MG/ML IV SOLN  Comparison:  Ultrasound of the abdomen dated 09/12/2012.  Comment:  The examination is suboptimal related to extensive patient respiratory motion.  Findings:  MRCP images demonstrate mild to moderate intrahepatic biliary ductal dilatation.  The bile ducts do not have a beaded appearance.  The common bile duct is also dilated measuring up to 13 mm in the porta hepatis.  Gallbladder is moderately distended. No gallbladder wall  thickening or  pericholecystic fluid.  Notably, there are no signal voids within the gallbladder or the common bile duct to suggest the presence of cholelithiasis or choledocholithiasis.  The common bile duct abruptly tapers to a normal caliber distally at the level of the ampulla.  This tapering appears to be rather abrupt, and there is some soft tissue fullness at the level of the ampulla where there appears to be a small amount of soft tissue which projects into the lumen of the duodenum (best demonstrated on images 26 of series 4 and 5).  Post gadolinium images demonstrate some progressive enhancement in this region (best demonstrated on image 74 of series 1804), suspicious for a small ampullary lesion.  This is difficult to discretely measure on today's MR images secondary to motion, but a suspected to be approximately 1.5 cm in size or less. The dorsal pancreatic duct is normal in caliber, and there appears to be pancreatic divisum anatomy.  This is best demonstrated on images 22 - 24 of series 4, but is poorly evaluated on other pulse sequences secondary to motion. The ventral pancreatic duct is also normal in caliber.  In the region of the uncinate process of the pancreas there is a tiny 8 x 5 mm lesion that is low signal intensity on T1- weighted images, high signal intensity on T2-weighted images, and does not appear to enhance.  No peripancreatic lymphadenopathy is noted.  No focal hepatic lesions are identified.  Diffuse loss of signal intensity throughout the hepatic parenchyma on the out of phase dual echo images, compatible with severe hepatic steatosis. The visualized portions of the spleen, bilateral adrenal glands and bilateral kidneys are unremarkable.  IMPRESSION:  1.  Mild to moderate intrahepatic biliary ductal dilatation, and dilatation of the common bile duct which abruptly tapers at the level of the ampulla.  It is uncertain whether not this is simply related to a distal common bile duct stricture, or is  related to an underlying ampullary neoplasm.  Further evaluation with endoscopic ultrasound is recommended at this time. 2.  There appears to be pancreatic divisum anatomy, as discussed above. 3.  No evidence of cholelithiasis or choledocholithiasis on today's examination. 4.  8 x 5 mm cystic lesion in the uncinate process of the pancreas is nonspecific and warrants attention on follow-up studies.  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  A 1-year follow-up MRI of the abdomen with contrast is recommended. 5.  Severe hepatic steatosis.   Original Report Authenticated By: Trudie Reed, M.D.   Mr Abd W/wo Cm/mrcp  09/14/2012   *RADIOLOGY REPORT*  Clinical Data:  Right upper quadrant pain, nausea and vomiting. Dilated common bile duct.  MRI ABDOMEN WITHOUT AND WITH CONTRAST (INCLUDING MRCP)  Technique:  Multiplanar multisequence MR imaging of the abdomen was performed both before and after the administration of intravenous contrast. Heavily T2-weighted images of the biliary and pancreatic ducts were obtained, and three-dimensional MRCP images were rendered by post processing.  Contrast: 20mL MULTIHANCE GADOBENATE DIMEGLUMINE 529 MG/ML IV SOLN  Comparison:  Ultrasound of the abdomen dated 09/12/2012.  Comment:  The examination is suboptimal related to extensive patient respiratory motion.  Findings:  MRCP images demonstrate mild to moderate intrahepatic biliary ductal dilatation.  The bile ducts do not have a beaded appearance.  The common bile duct is also dilated measuring up to 13 mm in the porta hepatis.  Gallbladder is moderately distended. No gallbladder wall thickening  or pericholecystic fluid.  Notably, there are no signal voids within the gallbladder or the common bile duct to suggest the presence of cholelithiasis or choledocholithiasis.  The common bile duct abruptly tapers to a normal caliber distally at the level of the ampulla.   This tapering appears to be rather abrupt, and there is some soft tissue fullness at the level of the ampulla where there appears to be a small amount of soft tissue which projects into the lumen of the duodenum (best demonstrated on images 26 of series 4 and 5).  Post gadolinium images demonstrate some progressive enhancement in this region (best demonstrated on image 74 of series 1804), suspicious for a small ampullary lesion.  This is difficult to discretely measure on today's MR images secondary to motion, but a suspected to be approximately 1.5 cm in size or less. The dorsal pancreatic duct is normal in caliber, and there appears to be pancreatic divisum anatomy.  This is best demonstrated on images 22 - 24 of series 4, but is poorly evaluated on other pulse sequences secondary to motion. The ventral pancreatic duct is also normal in caliber.  In the region of the uncinate process of the pancreas there is a tiny 8 x 5 mm lesion that is low signal intensity on T1- weighted images, high signal intensity on T2-weighted images, and does not appear to enhance.  No peripancreatic lymphadenopathy is noted.  No focal hepatic lesions are identified.  Diffuse loss of signal intensity throughout the hepatic parenchyma on the out of phase dual echo images, compatible with severe hepatic steatosis. The visualized portions of the spleen, bilateral adrenal glands and bilateral kidneys are unremarkable.  IMPRESSION:  1.  Mild to moderate intrahepatic biliary ductal dilatation, and dilatation of the common bile duct which abruptly tapers at the level of the ampulla.  It is uncertain whether not this is simply related to a distal common bile duct stricture, or is related to an underlying ampullary neoplasm.  Further evaluation with endoscopic ultrasound is recommended at this time. 2.  There appears to be pancreatic divisum anatomy, as discussed above. 3.  No evidence of cholelithiasis or choledocholithiasis on today's  examination. 4.  8 x 5 mm cystic lesion in the uncinate process of the pancreas is nonspecific and warrants attention on follow-up studies.  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  A 1-year follow-up MRI of the abdomen with contrast is recommended. 5.  Severe hepatic steatosis.   Original Report Authenticated By: Trudie Reed, M.D.    Jeoffrey Massed, MD  [facility] Pager:[phone]  If 7PM-7AM, please contact night-coverage [email or web address] Password [email or web address] 09/17/2012, 10:41 AM   LOS: 4 days

## 2012-09-17 NOTE — Anesthesia Postprocedure Evaluation (Signed)
Anesthesia Post Note  Patient: Sheryl Turner  Procedure(s) Performed: Procedure(s) (LRB): UPPER ENDOSCOPIC ULTRASOUND (EUS) RADIAL, possible LINEAR to follow, possible side-viewing endoscope (Left)  Anesthesia type: MAC  Patient location: PACU  Post pain: Pain level controlled  Post assessment: Post-op Vital signs reviewed  Last Vitals: BP 152/90  Pulse 81  Temp(Src) 36.3 C (Oral)  Resp 16  Ht 5\' 4"  (1.626 m)  Wt 211 lb 8 oz (95.936 kg)  BMI 36.29 kg/m2  SpO2 94%  Post vital signs: Reviewed  Level of consciousness: awake  Complications: No apparent anesthesia complications

## 2012-09-17 NOTE — Progress Notes (Signed)
Sheryl Turner 9:12 AM  Subjective: Patient is doing better overall with less abdominal pain and tolerating clear liquids and does have a few somatic complaints like headaches eye changes and leg cramps all predate ERCP and the findings were rediscussed with the patient and the case discussed with the hospital team she does say her urine is getting lighter and she does not seen has jaundice to me  Objective: Vital signs stable afebrile no acute distress abdomen is soft nontender CBC okay liver tests pending  Assessment: Biliary obstruction questionable etiology  Plan: Await pathology slowly advance diet and if she tolerates regular food possibly home soon with close outpatient followup and consider surgical consult versus CT next and not mentioned in ERCP report there was no cystic duct or gallbladder filling so possibly a HIDA scan would be needed to rule in cystic duct obstruction as well  Sheryl Turner E

## 2012-09-17 NOTE — Evaluation (Signed)
Physical Therapy Evaluation Patient Details Name: Sheryl Turner MRN: [id number] DOB: Oct 30, 1955 Today's Date: 09/17/2012 Time: 1330-1350 PT Time Calculation (min): 20 min  PT Assessment / Plan / Recommendation History of Present Illness     Clinical Impression  Pt admitted with abdominal pain with CBD obstruction and now s/p ERCP. Pt currently with functional limitations due to the deficits listed below (see PT Problem List).  Pt will benefit from skilled PT to increase their independence and safety with mobility to allow discharge to home with family support.      PT Assessment  Patient needs continued PT services    Follow Up Recommendations  No PT follow up    Does the patient have the potential to tolerate intense rehabilitation      Barriers to Discharge        Equipment Recommendations  Rolling walker with 5" wheels (depending on progress and if pt can acquire)    Recommendations for Other Services     Frequency Min 3X/week    Precautions / Restrictions Precautions Precaution Comments: s/p ERCP   Pertinent Vitals/Pain Pt denies pain, slight dizziness during gait however resolved sitting in chair      Mobility  Bed Mobility Bed Mobility: Supine to Sit Supine to Sit: 7: Independent Transfers Transfers: Sit to Stand;Stand to Sit Sit to Stand: 4: Min guard;With upper extremity assist;From toilet;From bed Stand to Sit: 4: Min guard;With upper extremity assist;To toilet;To chair/3-in-1 Details for Transfer Assistance: verbal cues for safe technique Ambulation/Gait Ambulation/Gait Assistance: 4: Min guard Ambulation Distance (Feet): 160 Feet Assistive device: Rolling walker Ambulation/Gait Assistance Details: verbal cues for safe use of RW, pt ambulated to bathroom with IV pole and 1 HHA so used RW for hallway Gait Pattern: Step-through pattern;Decreased stride length Gait velocity: decreased General Gait Details: slow cautious gait, reports first time walking  since admitted and feels good    Exercises     PT Diagnosis: Difficulty walking  PT Problem List: Decreased strength;Decreased activity tolerance;Decreased mobility;Decreased knowledge of use of DME PT Treatment Interventions: DME instruction;Gait training;Functional mobility training;Therapeutic activities;Stair training;Patient/family education;Therapeutic exercise     PT Goals(Current goals can be found in the care plan section) Acute Rehab PT Goals PT Goal Formulation: With patient Time For Goal Achievement: 09/24/12 Potential to Achieve Goals: Good  Visit Information  Last PT Received On: 09/17/12 Assistance Needed: +1       Prior Functioning  Home Living Family/patient expects to be discharged to:: Private residence Living Arrangements: Spouse/significant other Available Help at Discharge: Family;Available 24 hours/day Type of Home: House Home Access: Stairs to enter [employer] of Steps: "a coupleMedical illustrator: Right Home Layout: One level Home Equipment: None Prior Function Level of Independence: Independent Communication Communication: No difficulties    Cognition  Cognition Arousal/Alertness: Awake/alert Behavior During Therapy: WFL for tasks assessed/performed Overall Cognitive Status: Within Functional Limits for tasks assessed    Extremity/Trunk Assessment Lower Extremity Assessment Lower Extremity Assessment: Generalized weakness   Balance    End of Session PT - End of Session Activity Tolerance: Patient tolerated treatment well Patient left: in chair;with call bell/phone within reach;with family/visitor present  GP     Bowen Kia,Sheryl Turner 09/17/2012, 1:55 PM Zenovia Jarred, PT, DPT 09/17/2012 Pager: [phone]

## 2012-09-18 LAB — COMPREHENSIVE METABOLIC PANEL
Albumin: 2.4 g/dL — ABNORMAL LOW (ref 3.5–5.2)
BUN: 6 mg/dL (ref 6–23)
Creatinine, Ser: 0.6 mg/dL (ref 0.50–1.10)
Total Protein: 6.1 g/dL (ref 6.0–8.3)

## 2012-09-18 LAB — CBC WITH DIFFERENTIAL/PLATELET
Basophils Absolute: 0.1 10*3/uL (ref 0.0–0.1)
Lymphs Abs: 1.6 10*3/uL (ref 0.7–4.0)
MCH: 30.4 pg (ref 26.0–34.0)
MCV: 89.5 fL (ref 78.0–100.0)
Monocytes Absolute: 0.7 10*3/uL (ref 0.1–1.0)
Monocytes Relative: 9 % (ref 3–12)
Neutrophils Relative %: 68 % (ref 43–77)
Platelets: 130 10*3/uL — ABNORMAL LOW (ref 150–400)
RDW: 14.8 % (ref 11.5–15.5)
WBC: 7.6 10*3/uL (ref 4.0–10.5)

## 2012-09-18 MED ORDER — LEVOTHYROXINE SODIUM 100 MCG PO TABS
100.0000 ug | ORAL_TABLET | Freq: Every day | ORAL | Status: DC
  Administered 2012-09-19: 100 ug via ORAL
  Filled 2012-09-18 (×2): qty 1

## 2012-09-18 NOTE — Progress Notes (Signed)
PATIENT DETAILS Name: Sheryl Turner Age: 57 y.o. Sex: female Date of Birth: Oct 15, 1955 Admit Date: 09/13/2012 Admitting Physician Clydia Llano, MD WUJ:WJXB,JYNWGNF L, MD  Subjective: Continues to slowly improve-abdominal pain better-now ambulating. Tolerating full liquids  Assessment/Plan: Principal Problem:  Abdominal pain with CBD obstruction - Not exactly sure what the exact nature of the obstruction is, however EUS and acute onset of symptoms are suggestive of gallstones/biliary sludge etiology.Although, has CBD obstruction, LFTs,are not entirely consistent with a hepatocellular jaundice. - ERCP with stent placement and brushings done on 7/3. Following biliary stent placement-LFT's now downtrending - ?Would likely need a cholecystectomy at some point during this admission. Spoke with [name], he suggested to advance diet and see how patient does first-may need further evaluation only if cannot advance diet. Thankfully tolerated full liquids yesterday-advance to regular diet today - Continue with supportive care- IV ciprofloxacin.  Active Problems: Thrombocytopenia - Stopped prophylactic Lovenox on 7/3,  HIT panel pending. Could be do to acute illness as well -  vitamin B12 923 -platelets slightly up today-continue to monitor  History of hypothyroidism - Currently on IV levothyroxine-change to oral dosing today  Disposition: Remain inpatient-home in am  DVT Prophylaxis:  SCD's- given dropping platelet count  Code Status: Full code   Family Communication Husband at bedside  Procedures:  None  CONSULTS:  GI   MEDICATIONS: Scheduled Meds: . ciprofloxacin  400 mg Intravenous Q12H  . [START ON 09/19/2012] levothyroxine  100 mcg Oral QAC breakfast  . LORazepam  0.5 mg Intravenous Once  . ondansetron  4 mg Oral Once  . pantoprazole (PROTONIX) IV  40 mg Intravenous Q24H  . sodium chloride  3 mL Intravenous Q12H   Continuous Infusions: . sodium chloride 0.9 % 1,000  mL with potassium chloride 20 mEq infusion 75 mL/hr at 09/18/12 0223   PRN Meds:.fentaNYL, HYDROmorphone (DILAUDID) injection, ibuprofen, LORazepam, ondansetron (ZOFRAN) IV, ondansetron, promethazine  Antibiotics: Anti-infectives   Start     Dose/Rate Route Frequency Ordered Stop   09/13/12 1800  ciprofloxacin (CIPRO) IVPB 400 mg     400 mg 200 mL/hr over 60 Minutes Intravenous Every 12 hours 09/13/12 1732         PHYSICAL EXAM: Vital signs in last 24 hours: Filed Vitals:   09/17/12 0516 09/17/12 1423 09/17/12 2115 09/18/12 0425  BP: 152/90 143/86 122/80 135/87  Pulse: 81 80 87 79  Temp: 97.4 F (36.3 C) 98.4 F (36.9 C) 99.1 F (37.3 C) 98.4 F (36.9 C)  TempSrc: Oral Oral Oral Oral  Resp: 16 18 18 18   Height:      Weight:      SpO2: 94% 97% 96% 93%    Weight change:  Filed Weights   09/13/12 1700  Weight: 95.936 kg (211 lb 8 oz)   Body mass index is 36.29 kg/(m^2).   Gen Exam: Awake and alert with clear speech.   Neck: Supple, No JVD.   Chest: B/L Clear.   CVS: S1 S2 Regular, no murmurs.  Abdomen: soft, BS +, mildly tender in the RUQ area, non distended.  Extremities: no edema, lower extremities warm to touch. Neurologic: Non Focal.   Skin: No Rash.   Wounds: N/A.   Intake/Output from previous day:  Intake/Output Summary (Last 24 hours) at 09/18/12 0910 Last data filed at 09/18/12 0603  Gross per 24 hour  Intake 2741.67 ml  Output      0 ml  Net 2741.67 ml     LAB RESULTS: CBC  Recent  Labs Lab 09/12/12 1630 09/13/12 1146  09/14/12 0845 09/15/12 0653 09/16/12 0531 09/17/12 0735 09/18/12 0408  WBC 6.7 10.0  < > 3.5* 9.9 8.6 7.6 7.6  HGB 13.5 13.7  < > 11.5* 10.6* 11.1* 11.5* 12.4  HCT 40.3 40.9  < > 35.0* 32.4* 34.2* 34.5* 36.5  PLT 234 215  < > 144* 99* 97* 112* 130*  MCV 92.6 91.9  < > 93.1 93.4 93.4 90.8 89.5  MCH 31.0 30.8  < > 30.6 30.5 30.3 30.3 30.4  MCHC 33.5 33.5  < > 32.9 32.7 32.5 33.3 34.0  RDW 13.8 13.8  < > 14.4 15.1 15.2  14.9 14.8  LYMPHSABS 2.5 0.5*  --   --   --   --  1.2 1.6  MONOABS 0.4 0.2  --   --   --   --  0.5 0.7  EOSABS 0.1 0.0  --   --   --   --  0.1 0.1  BASOSABS 0.1 0.0  --   --   --   --  0.0 0.1  < > = values in this interval not displayed.  Chemistries   Recent Labs Lab 09/14/12 0845 09/15/12 0653 09/16/12 0531 09/17/12 0735 09/18/12 0408  NA 136 137 137 136 137  K 3.3* 3.8 3.9 3.7 3.7  CL 103 105 106 104 106  CO2 21 20 20 21 23   GLUCOSE 87 117* 90 111* 102*  BUN 15 22 12 8 6   CREATININE 0.99 1.00 0.67 0.62 0.60  CALCIUM 8.0* 7.6* 8.4 8.5 8.5    CBG: No results found for this basename: GLUCAP,  in the last 168 hours  GFR Estimated Creatinine Clearance: 89.3 ml/min (by C-G formula based on Cr of 0.6).  Coagulation profile No results found for this basename: INR, PROTIME,  in the last 168 hours  Cardiac Enzymes  Recent Labs Lab 09/12/12 1637  TROPONINI <0.30    No components found with this basename: POCBNP,  No results found for this basename: DDIMER,  in the last 72 hours No results found for this basename: HGBA1C,  in the last 72 hours No results found for this basename: CHOL, HDL, LDLCALC, TRIG, CHOLHDL, LDLDIRECT,  in the last 72 hours No results found for this basename: TSH, T4TOTAL, FREET3, T3FREE, THYROIDAB,  in the last 72 hours  Recent Labs  09/17/12 0735  VITAMINB12 923*   No results found for this basename: LIPASE, AMYLASE,  in the last 72 hours  Urine Studies No results found for this basename: UACOL, UAPR, USPG, UPH, UTP, UGL, UKET, UBIL, UHGB, UNIT, UROB, ULEU, UEPI, UWBC, URBC, UBAC, CAST, CRYS, UCOM, BILUA,  in the last 72 hours  MICROBIOLOGY: Recent Results (from the past 240 hour(s))  CULTURE, BLOOD (ROUTINE X 2)     Status: None   Collection Time    09/13/12  8:00 PM      Result Value Range Status   Specimen Description BLOOD ARM LEFT   Final   Special Requests BOTTLES DRAWN AEROBIC ONLY 5CC   Final   Culture  Setup Time 09/14/2012  03:48   Final   Culture     Final   Value:        BLOOD CULTURE RECEIVED NO GROWTH TO DATE CULTURE WILL BE HELD FOR 5 DAYS BEFORE ISSUING A FINAL NEGATIVE REPORT   Report Status PENDING   Incomplete  CULTURE, BLOOD (ROUTINE X 2)     Status: None   Collection Time  09/13/12  8:15 PM      Result Value Range Status   Specimen Description BLOOD HAND LEFT   Final   Special Requests BOTTLES DRAWN AEROBIC ONLY 5CC   Final   Culture  Setup Time 09/14/2012 03:29   Final   Culture     Final   Value:        BLOOD CULTURE RECEIVED NO GROWTH TO DATE CULTURE WILL BE HELD FOR 5 DAYS BEFORE ISSUING A FINAL NEGATIVE REPORT   Report Status PENDING   Incomplete    RADIOLOGY STUDIES/RESULTS: Dg Chest 2 View  09/12/2012   *RADIOLOGY REPORT*  Clinical Data: Upper abdominal pain.  Shortness of breath.  CHEST - 2 VIEW  Comparison: 06/28/2004.  Findings: The heart, mediastinal and hilar contours are normal. An azygos lobe is incidentally noted.  A small, calcified density described as projecting over the anterior  right first rib on the preceding study is again identified and is unchanged.  The lungs are clear.  There are no effusions or pneumothoraces.  There are no acute bony changes.  IMPRESSION: No active disease.   Original Report Authenticated By: Sander Radon, M.D.   US Abdomen Complete  09/12/2012   *RADIOLOGY REPORT*  Clinical Data:  Mid to right upper quadrant abdominal pain for 3 hours, extending into the back.  History of appendectomy and hysterectomy. Mildly elevated lipase levels.  COMPLETE ABDOMINAL ULTRASOUND  Comparison:  Abdominal pelvic CT 06/29/1994.  Findings:  Gallbladder: Well distended without wall thickening, stones or pericholecystic fluid. Negative sonographic Murphy's sign.  Common bile duct:   The common bile duct is dilated to 13 mm maximally.  The distal duct is not well visualized.  No intraductal calculi are seen.  Liver:  There is increased hepatic echogenicity most consistent  with steatosis.  No focal liver lesions are identified.  IVC:  Visualized portions appear unremarkable.  Pancreas:  The pancreatic duct is at the upper limits of normal in caliber, measuring 4 mm.  No focal pancreatic abnormality is seen.  Spleen:  Visualized portions appear unremarkable.  Right Kidney:   The renal cortical thickness and echogenicity are preserved.  There is no hydronephrosis or focal abnormality. Renal length is 11.0 cm.  Left Kidney:   The renal cortical thickness and echogenicity are preserved.  There is no hydronephrosis or focal abnormality. Renal length is 11.0 cm.  Abdominal aorta:  Visualized portions appear unremarkable.  The distal aorta is partly obscured by bowel gas.  IMPRESSION:  1.  Extrahepatic biliary dilatation may be secondary to pancreatitis or non visualized choledocholithiasis. 2.  Increased hepatic echogenicity, most consistent with steatosis. 3.  No evidence of cholelithiasis or cholecystitis.   Original Report Authenticated By: Carey Bullocks, M.D.   Mr 3d Recon At Scanner  09/14/2012   *RADIOLOGY REPORT*  Clinical Data:  Right upper quadrant pain, nausea and vomiting. Dilated common bile duct.  MRI ABDOMEN WITHOUT AND WITH CONTRAST (INCLUDING MRCP)  Technique:  Multiplanar multisequence MR imaging of the abdomen was performed both before and after the administration of intravenous contrast. Heavily T2-weighted images of the biliary and pancreatic ducts were obtained, and three-dimensional MRCP images were rendered by post processing.  Contrast: 20mL MULTIHANCE GADOBENATE DIMEGLUMINE 529 MG/ML IV SOLN  Comparison:  Ultrasound of the abdomen dated 09/12/2012.  Comment:  The examination is suboptimal related to extensive patient respiratory motion.  Findings:  MRCP images demonstrate mild to moderate intrahepatic biliary ductal dilatation.  The bile ducts do not have a beaded  appearance.  The common bile duct is also dilated measuring up to 13 mm in the porta hepatis.   Gallbladder is moderately distended. No gallbladder wall thickening or pericholecystic fluid.  Notably, there are no signal voids within the gallbladder or the common bile duct to suggest the presence of cholelithiasis or choledocholithiasis.  The common bile duct abruptly tapers to a normal caliber distally at the level of the ampulla.  This tapering appears to be rather abrupt, and there is some soft tissue fullness at the level of the ampulla where there appears to be a small amount of soft tissue which projects into the lumen of the duodenum (best demonstrated on images 26 of series 4 and 5).  Post gadolinium images demonstrate some progressive enhancement in this region (best demonstrated on image 74 of series 1804), suspicious for a small ampullary lesion.  This is difficult to discretely measure on today's MR images secondary to motion, but a suspected to be approximately 1.5 cm in size or less. The dorsal pancreatic duct is normal in caliber, and there appears to be pancreatic divisum anatomy.  This is best demonstrated on images 22 - 24 of series 4, but is poorly evaluated on other pulse sequences secondary to motion. The ventral pancreatic duct is also normal in caliber.  In the region of the uncinate process of the pancreas there is a tiny 8 x 5 mm lesion that is low signal intensity on T1- weighted images, high signal intensity on T2-weighted images, and does not appear to enhance.  No peripancreatic lymphadenopathy is noted.  No focal hepatic lesions are identified.  Diffuse loss of signal intensity throughout the hepatic parenchyma on the out of phase dual echo images, compatible with severe hepatic steatosis. The visualized portions of the spleen, bilateral adrenal glands and bilateral kidneys are unremarkable.  IMPRESSION:  1.  Mild to moderate intrahepatic biliary ductal dilatation, and dilatation of the common bile duct which abruptly tapers at the level of the ampulla.  It is uncertain whether not  this is simply related to a distal common bile duct stricture, or is related to an underlying ampullary neoplasm.  Further evaluation with endoscopic ultrasound is recommended at this time. 2.  There appears to be pancreatic divisum anatomy, as discussed above. 3.  No evidence of cholelithiasis or choledocholithiasis on today's examination. 4.  8 x 5 mm cystic lesion in the uncinate process of the pancreas is nonspecific and warrants attention on follow-up studies.  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  A 1-year follow-up MRI of the abdomen with contrast is recommended. 5.  Severe hepatic steatosis.   Original Report Authenticated By: Trudie Reed, M.D.   Mr Abd W/wo Cm/mrcp  09/14/2012   *RADIOLOGY REPORT*  Clinical Data:  Right upper quadrant pain, nausea and vomiting. Dilated common bile duct.  MRI ABDOMEN WITHOUT AND WITH CONTRAST (INCLUDING MRCP)  Technique:  Multiplanar multisequence MR imaging of the abdomen was performed both before and after the administration of intravenous contrast. Heavily T2-weighted images of the biliary and pancreatic ducts were obtained, and three-dimensional MRCP images were rendered by post processing.  Contrast: 20mL MULTIHANCE GADOBENATE DIMEGLUMINE 529 MG/ML IV SOLN  Comparison:  Ultrasound of the abdomen dated 09/12/2012.  Comment:  The examination is suboptimal related to extensive patient respiratory motion.  Findings:  MRCP images demonstrate mild to moderate intrahepatic biliary ductal dilatation.  The bile ducts do not have a beaded appearance.  The common bile duct is also dilated measuring up to 13 mm in the porta hepatis.  Gallbladder is moderately distended. No gallbladder wall thickening or pericholecystic fluid.  Notably, there are no signal voids within the gallbladder or the common bile duct to suggest the presence of cholelithiasis or choledocholithiasis.  The common bile duct abruptly  tapers to a normal caliber distally at the level of the ampulla.  This tapering appears to be rather abrupt, and there is some soft tissue fullness at the level of the ampulla where there appears to be a small amount of soft tissue which projects into the lumen of the duodenum (best demonstrated on images 26 of series 4 and 5).  Post gadolinium images demonstrate some progressive enhancement in this region (best demonstrated on image 74 of series 1804), suspicious for a small ampullary lesion.  This is difficult to discretely measure on today's MR images secondary to motion, but a suspected to be approximately 1.5 cm in size or less. The dorsal pancreatic duct is normal in caliber, and there appears to be pancreatic divisum anatomy.  This is best demonstrated on images 22 - 24 of series 4, but is poorly evaluated on other pulse sequences secondary to motion. The ventral pancreatic duct is also normal in caliber.  In the region of the uncinate process of the pancreas there is a tiny 8 x 5 mm lesion that is low signal intensity on T1- weighted images, high signal intensity on T2-weighted images, and does not appear to enhance.  No peripancreatic lymphadenopathy is noted.  No focal hepatic lesions are identified.  Diffuse loss of signal intensity throughout the hepatic parenchyma on the out of phase dual echo images, compatible with severe hepatic steatosis. The visualized portions of the spleen, bilateral adrenal glands and bilateral kidneys are unremarkable.  IMPRESSION:  1.  Mild to moderate intrahepatic biliary ductal dilatation, and dilatation of the common bile duct which abruptly tapers at the level of the ampulla.  It is uncertain whether not this is simply related to a distal common bile duct stricture, or is related to an underlying ampullary neoplasm.  Further evaluation with endoscopic ultrasound is recommended at this time. 2.  There appears to be pancreatic divisum anatomy, as discussed above. 3.  No  evidence of cholelithiasis or choledocholithiasis on today's examination. 4.  8 x 5 mm cystic lesion in the uncinate process of the pancreas is nonspecific and warrants attention on follow-up studies.  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  A 1-year follow-up MRI of the abdomen with contrast is recommended. 5.  Severe hepatic steatosis.   Original Report Authenticated By: Trudie Reed, M.D.    Jeoffrey Massed, MD  [facility] Pager:[phone]  If 7PM-7AM, please contact night-coverage [email or web address] Password [email or web address] 09/18/2012, 9:10 AM   LOS: 5 days

## 2012-09-18 NOTE — Progress Notes (Signed)
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 Dulce Sellar per Dr. Madilyn Fireman who know her case well in my absence next week and I discussed that with the patient and her husband as well  Sheryl Turner E

## 2012-09-19 LAB — HEPATIC FUNCTION PANEL
ALT: 159 U/L — ABNORMAL HIGH (ref 0–35)
AST: 40 U/L — ABNORMAL HIGH (ref 0–37)
Albumin: 2.4 g/dL — ABNORMAL LOW (ref 3.5–5.2)
Alkaline Phosphatase: 112 U/L (ref 39–117)
Total Protein: 6 g/dL (ref 6.0–8.3)

## 2012-09-19 MED ORDER — PANTOPRAZOLE SODIUM 40 MG PO TBEC: 40.0000 mg | DELAYED_RELEASE_TABLET | Freq: Every day | ORAL | Status: DC

## 2012-09-19 MED ORDER — OXYCODONE HCL 5 MG PO CAPS: 5.0000 mg | ORAL_CAPSULE | Freq: Four times a day (QID) | ORAL | Status: DC | PRN

## 2012-09-19 MED ORDER — ONDANSETRON HCL 4 MG PO TABS: 4.0000 mg | ORAL_TABLET | Freq: Four times a day (QID) | ORAL | Status: DC

## 2012-09-19 NOTE — Progress Notes (Signed)
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 Madilyn Fireman or outlaw  [facility]

## 2012-09-19 NOTE — Progress Notes (Signed)
PATIENT DETAILS Name: Sheryl Turner Age: 57 y.o. Sex: female Date of Birth: 12/09/1955 Admit Date: 09/13/2012 Admitting Physician Clydia Llano, MD ZOX:[id number] L, MD  Subjective: Improvement continues, tolerating regular diet. Intermittent mild abd pain and nausea. No vomiting. Had a BM yesterday  Assessment/Plan: Principal Problem:  Abdominal pain with CBD obstruction - Not exactly sure what the exact nature of the obstruction is, however EUS and acute onset of symptoms are suggestive of gallstones/biliary sludge etiology.Although, has CBD obstruction, LFTs,are not entirely consistent with a hepatocellular jaundice. - ERCP with stent placement and brushings done on 7/3. Following biliary stent placement-LFT's now downtrending - ?Would likely need a cholecystectomy at some point during this admission. Spoke with [name], he suggested to advance diet and see how patient does first-may need further evaluation only if cannot advance diet. Thankfully tolerated full liquids then-advanced to regular diet yesterday-which she has tolerated. Eating breakfast this am when I walked in. -discharge today-further work up will be done in the outpatient setting by [facility] (See Dr Marlane Hatcher note on 7/5)  Active Problems: Thrombocytopenia - Stopped prophylactic Lovenox on 7/3,  HIT panel pending. Could be do to acute illness as well -  vitamin B12 923 -platelets slightly up today-continue to monitor in the outpatient setting  History of hypothyroidism - Was on IV levothyroxine-changed to oral dosing   Disposition: Remain inpatient-home today  DVT Prophylaxis:  SCD's- given dropping platelet count-now stable  Code Status: Full code   Family Communication Husband at bedside 7/5 Sister at bedside today  Procedures:  None  CONSULTS:  GI   MEDICATIONS: Scheduled Meds: . ciprofloxacin  400 mg Intravenous Q12H  . levothyroxine  100 mcg Oral QAC breakfast  . LORazepam  0.5 mg  Intravenous Once  . ondansetron  4 mg Oral Once  . pantoprazole (PROTONIX) IV  40 mg Intravenous Q24H  . sodium chloride  3 mL Intravenous Q12H   Continuous Infusions: . sodium chloride 0.9 % 1,000 mL with potassium chloride 20 mEq infusion 75 mL/hr at 09/19/12 0851   PRN Meds:.fentaNYL, HYDROmorphone (DILAUDID) injection, ibuprofen, LORazepam, ondansetron (ZOFRAN) IV, ondansetron, promethazine  Antibiotics: Anti-infectives   Start     Dose/Rate Route Frequency Ordered Stop   09/13/12 1800  ciprofloxacin (CIPRO) IVPB 400 mg     400 mg 200 mL/hr over 60 Minutes Intravenous Every 12 hours 09/13/12 1732         PHYSICAL EXAM: Vital signs in last 24 hours: Filed Vitals:   09/18/12 0425 09/18/12 1723 09/18/12 2145 09/19/12 0500  BP: 135/87 145/92 124/83 116/68  Pulse: 79 84 78 74  Temp: 98.4 F (36.9 C) 98.4 F (36.9 C) 98.1 F (36.7 C) 98.4 F (36.9 C)  TempSrc: Oral Oral Oral Oral  Resp: 18 18 18 16   Height:      Weight:      SpO2: 93% 96% 94% 98%    Weight change:  Filed Weights   09/13/12 1700  Weight: 95.936 kg (211 lb 8 oz)   Body mass index is 36.29 kg/(m^2).   Gen Exam: Awake and alert with clear speech.   Neck: Supple, No JVD.   Chest: B/L Clear.   CVS: S1 S2 Regular, no murmurs.  Abdomen: soft, BS +, non tender today, non distended.  Extremities: no edema, lower extremities warm to touch. Neurologic: Non Focal.   Skin: No Rash.   Wounds: N/A.   Intake/Output from previous day:  Intake/Output Summary (Last 24 hours) at 09/19/12 1022 Last data filed  at 09/19/12 0605  Gross per 24 hour  Intake 1721.25 ml  Output      0 ml  Net 1721.25 ml     LAB RESULTS: CBC  Recent Labs Lab 09/12/12 1630 09/13/12 1146  09/14/12 0845 09/15/12 0653 09/16/12 0531 09/17/12 0735 09/18/12 0408  WBC 6.7 10.0  < > 3.5* 9.9 8.6 7.6 7.6  HGB 13.5 13.7  < > 11.5* 10.6* 11.1* 11.5* 12.4  HCT 40.3 40.9  < > 35.0* 32.4* 34.2* 34.5* 36.5  PLT 234 215  < > 144* 99*  97* 112* 130*  MCV 92.6 91.9  < > 93.1 93.4 93.4 90.8 89.5  MCH 31.0 30.8  < > 30.6 30.5 30.3 30.3 30.4  MCHC 33.5 33.5  < > 32.9 32.7 32.5 33.3 34.0  RDW 13.8 13.8  < > 14.4 15.1 15.2 14.9 14.8  LYMPHSABS 2.5 0.5*  --   --   --   --  1.2 1.6  MONOABS 0.4 0.2  --   --   --   --  0.5 0.7  EOSABS 0.1 0.0  --   --   --   --  0.1 0.1  BASOSABS 0.1 0.0  --   --   --   --  0.0 0.1  < > = values in this interval not displayed.  Chemistries   Recent Labs Lab 09/14/12 0845 09/15/12 0653 09/16/12 0531 09/17/12 0735 09/18/12 0408  NA 136 137 137 136 137  K 3.3* 3.8 3.9 3.7 3.7  CL 103 105 106 104 106  CO2 21 20 20 21 23   GLUCOSE 87 117* 90 111* 102*  BUN 15 22 12 8 6   CREATININE 0.99 1.00 0.67 0.62 0.60  CALCIUM 8.0* 7.6* 8.4 8.5 8.5    CBG: No results found for this basename: GLUCAP,  in the last 168 hours  GFR Estimated Creatinine Clearance: 89.3 ml/min (by C-G formula based on Cr of 0.6).  Coagulation profile No results found for this basename: INR, PROTIME,  in the last 168 hours  Cardiac Enzymes  Recent Labs Lab 09/12/12 1637  TROPONINI <0.30    No components found with this basename: POCBNP,  No results found for this basename: DDIMER,  in the last 72 hours No results found for this basename: HGBA1C,  in the last 72 hours No results found for this basename: CHOL, HDL, LDLCALC, TRIG, CHOLHDL, LDLDIRECT,  in the last 72 hours No results found for this basename: TSH, T4TOTAL, FREET3, T3FREE, THYROIDAB,  in the last 72 hours  Recent Labs  09/17/12 0735  VITAMINB12 923*   No results found for this basename: LIPASE, AMYLASE,  in the last 72 hours  Urine Studies No results found for this basename: UACOL, UAPR, USPG, UPH, UTP, UGL, UKET, UBIL, UHGB, UNIT, UROB, ULEU, UEPI, UWBC, URBC, UBAC, CAST, CRYS, UCOM, BILUA,  in the last 72 hours  MICROBIOLOGY: Recent Results (from the past 240 hour(s))  CULTURE, BLOOD (ROUTINE X 2)     Status: None   Collection Time     09/13/12  8:00 PM      Result Value Range Status   Specimen Description BLOOD ARM LEFT   Final   Special Requests BOTTLES DRAWN AEROBIC ONLY 5CC   Final   Culture  Setup Time 09/14/2012 03:48   Final   Culture     Final   Value:        BLOOD CULTURE RECEIVED NO GROWTH TO DATE CULTURE WILL BE HELD  FOR 5 DAYS BEFORE ISSUING A FINAL NEGATIVE REPORT   Report Status PENDING   Incomplete  CULTURE, BLOOD (ROUTINE X 2)     Status: None   Collection Time    09/13/12  8:15 PM      Result Value Range Status   Specimen Description BLOOD HAND LEFT   Final   Special Requests BOTTLES DRAWN AEROBIC ONLY 5CC   Final   Culture  Setup Time 09/14/2012 03:29   Final   Culture     Final   Value:        BLOOD CULTURE RECEIVED NO GROWTH TO DATE CULTURE WILL BE HELD FOR 5 DAYS BEFORE ISSUING A FINAL NEGATIVE REPORT   Report Status PENDING   Incomplete    RADIOLOGY STUDIES/RESULTS: Dg Chest 2 View  09/12/2012   *RADIOLOGY REPORT*  Clinical Data: Upper abdominal pain.  Shortness of breath.  CHEST - 2 VIEW  Comparison: 06/28/2004.  Findings: The heart, mediastinal and hilar contours are normal. An azygos lobe is incidentally noted.  A small, calcified density described as projecting over the anterior  right first rib on the preceding study is again identified and is unchanged.  The lungs are clear.  There are no effusions or pneumothoraces.  There are no acute bony changes.  IMPRESSION: No active disease.   Original Report Authenticated By: Sander Radon, M.D.   US Abdomen Complete  09/12/2012   *RADIOLOGY REPORT*  Clinical Data:  Mid to right upper quadrant abdominal pain for 3 hours, extending into the back.  History of appendectomy and hysterectomy. Mildly elevated lipase levels.  COMPLETE ABDOMINAL ULTRASOUND  Comparison:  Abdominal pelvic CT 06/29/1994.  Findings:  Gallbladder: Well distended without wall thickening, stones or pericholecystic fluid. Negative sonographic Murphy's sign.  Common bile duct:   The  common bile duct is dilated to 13 mm maximally.  The distal duct is not well visualized.  No intraductal calculi are seen.  Liver:  There is increased hepatic echogenicity most consistent with steatosis.  No focal liver lesions are identified.  IVC:  Visualized portions appear unremarkable.  Pancreas:  The pancreatic duct is at the upper limits of normal in caliber, measuring 4 mm.  No focal pancreatic abnormality is seen.  Spleen:  Visualized portions appear unremarkable.  Right Kidney:   The renal cortical thickness and echogenicity are preserved.  There is no hydronephrosis or focal abnormality. Renal length is 11.0 cm.  Left Kidney:   The renal cortical thickness and echogenicity are preserved.  There is no hydronephrosis or focal abnormality. Renal length is 11.0 cm.  Abdominal aorta:  Visualized portions appear unremarkable.  The distal aorta is partly obscured by bowel gas.  IMPRESSION:  1.  Extrahepatic biliary dilatation may be secondary to pancreatitis or non visualized choledocholithiasis. 2.  Increased hepatic echogenicity, most consistent with steatosis. 3.  No evidence of cholelithiasis or cholecystitis.   Original Report Authenticated By: Carey Bullocks, M.D.   Mr 3d Recon At Scanner  09/14/2012   *RADIOLOGY REPORT*  Clinical Data:  Right upper quadrant pain, nausea and vomiting. Dilated common bile duct.  MRI ABDOMEN WITHOUT AND WITH CONTRAST (INCLUDING MRCP)  Technique:  Multiplanar multisequence MR imaging of the abdomen was performed both before and after the administration of intravenous contrast. Heavily T2-weighted images of the biliary and pancreatic ducts were obtained, and three-dimensional MRCP images were rendered by post processing.  Contrast: 20mL MULTIHANCE GADOBENATE DIMEGLUMINE 529 MG/ML IV SOLN  Comparison:  Ultrasound of the abdomen dated  09/12/2012.  Comment:  The examination is suboptimal related to extensive patient respiratory motion.  Findings:  MRCP images demonstrate mild  to moderate intrahepatic biliary ductal dilatation.  The bile ducts do not have a beaded appearance.  The common bile duct is also dilated measuring up to 13 mm in the porta hepatis.  Gallbladder is moderately distended. No gallbladder wall thickening or pericholecystic fluid.  Notably, there are no signal voids within the gallbladder or the common bile duct to suggest the presence of cholelithiasis or choledocholithiasis.  The common bile duct abruptly tapers to a normal caliber distally at the level of the ampulla.  This tapering appears to be rather abrupt, and there is some soft tissue fullness at the level of the ampulla where there appears to be a small amount of soft tissue which projects into the lumen of the duodenum (best demonstrated on images 26 of series 4 and 5).  Post gadolinium images demonstrate some progressive enhancement in this region (best demonstrated on image 74 of series 1804), suspicious for a small ampullary lesion.  This is difficult to discretely measure on today's MR images secondary to motion, but a suspected to be approximately 1.5 cm in size or less. The dorsal pancreatic duct is normal in caliber, and there appears to be pancreatic divisum anatomy.  This is best demonstrated on images 22 - 24 of series 4, but is poorly evaluated on other pulse sequences secondary to motion. The ventral pancreatic duct is also normal in caliber.  In the region of the uncinate process of the pancreas there is a tiny 8 x 5 mm lesion that is low signal intensity on T1- weighted images, high signal intensity on T2-weighted images, and does not appear to enhance.  No peripancreatic lymphadenopathy is noted.  No focal hepatic lesions are identified.  Diffuse loss of signal intensity throughout the hepatic parenchyma on the out of phase dual echo images, compatible with severe hepatic steatosis. The visualized portions of the spleen, bilateral adrenal glands and bilateral kidneys are unremarkable.   IMPRESSION:  1.  Mild to moderate intrahepatic biliary ductal dilatation, and dilatation of the common bile duct which abruptly tapers at the level of the ampulla.  It is uncertain whether not this is simply related to a distal common bile duct stricture, or is related to an underlying ampullary neoplasm.  Further evaluation with endoscopic ultrasound is recommended at this time. 2.  There appears to be pancreatic divisum anatomy, as discussed above. 3.  No evidence of cholelithiasis or choledocholithiasis on today's examination. 4.  8 x 5 mm cystic lesion in the uncinate process of the pancreas is nonspecific and warrants attention on follow-up studies.  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  A 1-year follow-up MRI of the abdomen with contrast is recommended. 5.  Severe hepatic steatosis.   Original Report Authenticated By: Trudie Reed, M.D.   Mr Abd W/wo Cm/mrcp  09/14/2012   *RADIOLOGY REPORT*  Clinical Data:  Right upper quadrant pain, nausea and vomiting. Dilated common bile duct.  MRI ABDOMEN WITHOUT AND WITH CONTRAST (INCLUDING MRCP)  Technique:  Multiplanar multisequence MR imaging of the abdomen was performed both before and after the administration of intravenous contrast. Heavily T2-weighted images of the biliary and pancreatic ducts were obtained, and three-dimensional MRCP images were rendered by post processing.  Contrast: 20mL MULTIHANCE GADOBENATE DIMEGLUMINE 529 MG/ML IV SOLN  Comparison:  Ultrasound of the abdomen dated 09/12/2012.  Comment:  The examination is suboptimal related to extensive patient respiratory motion.  Findings:  MRCP images demonstrate mild to moderate intrahepatic biliary ductal dilatation.  The bile ducts do not have a beaded appearance.  The common bile duct is also dilated measuring up to 13 mm in the porta hepatis.  Gallbladder is moderately distended. No gallbladder wall thickening or  pericholecystic fluid.  Notably, there are no signal voids within the gallbladder or the common bile duct to suggest the presence of cholelithiasis or choledocholithiasis.  The common bile duct abruptly tapers to a normal caliber distally at the level of the ampulla.  This tapering appears to be rather abrupt, and there is some soft tissue fullness at the level of the ampulla where there appears to be a small amount of soft tissue which projects into the lumen of the duodenum (best demonstrated on images 26 of series 4 and 5).  Post gadolinium images demonstrate some progressive enhancement in this region (best demonstrated on image 74 of series 1804), suspicious for a small ampullary lesion.  This is difficult to discretely measure on today's MR images secondary to motion, but a suspected to be approximately 1.5 cm in size or less. The dorsal pancreatic duct is normal in caliber, and there appears to be pancreatic divisum anatomy.  This is best demonstrated on images 22 - 24 of series 4, but is poorly evaluated on other pulse sequences secondary to motion. The ventral pancreatic duct is also normal in caliber.  In the region of the uncinate process of the pancreas there is a tiny 8 x 5 mm lesion that is low signal intensity on T1- weighted images, high signal intensity on T2-weighted images, and does not appear to enhance.  No peripancreatic lymphadenopathy is noted.  No focal hepatic lesions are identified.  Diffuse loss of signal intensity throughout the hepatic parenchyma on the out of phase dual echo images, compatible with severe hepatic steatosis. The visualized portions of the spleen, bilateral adrenal glands and bilateral kidneys are unremarkable.  IMPRESSION:  1.  Mild to moderate intrahepatic biliary ductal dilatation, and dilatation of the common bile duct which abruptly tapers at the level of the ampulla.  It is uncertain whether not this is simply related to a distal common bile duct stricture, or is  related to an underlying ampullary neoplasm.  Further evaluation with endoscopic ultrasound is recommended at this time. 2.  There appears to be pancreatic divisum anatomy, as discussed above. 3.  No evidence of cholelithiasis or choledocholithiasis on today's examination. 4.  8 x 5 mm cystic lesion in the uncinate process of the pancreas is nonspecific and warrants attention on follow-up studies.  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  A 1-year follow-up MRI of the abdomen with contrast is recommended. 5.  Severe hepatic steatosis.   Original Report Authenticated By: Trudie Reed, M.D.    Jeoffrey Massed, MD  [facility] Pager:[phone]  If 7PM-7AM, please contact night-coverage [email or web address] Password [email or web address] 09/19/2012, 10:22 AM   LOS: 6 days

## 2012-09-19 NOTE — Discharge Summary (Signed)
PATIENT DETAILS Name: Sheryl Turner Age: 57 y.o. Sex: female Date of Birth: December 28, 1955 MRN: [id number]. Admit Date: 09/13/2012 Admitting Physician: Clydia Llano, MD WUJ:[id number] L, MD  Recommendations for Outpatient Follow-up:  1. 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 Please monitor platelet count and LFTs at next visit 3. Please follow biopsy results of ERCP brushing 4. Needs followup with [facility] for further continued outpatient workup  PRIMARY DISCHARGE DIAGNOSIS:  Principal Problem:   Acute epigastric pain Active Problems:   CBD obstruction   Obstructive jaundice      PAST MEDICAL HISTORY: Past Medical History  Diagnosis Date  . Pancreatitis   . Thyroid disease   . Hypothyroidism   . Shortness of breath     on exertion  . GERD (gastroesophageal reflux disease)   . H/O hiatal hernia     DISCHARGE MEDICATIONS:   Medication List    STOP taking these medications       traMADol 50 MG tablet  Commonly known as:  ULTRAM      TAKE these medications       levothyroxine 100 MCG tablet  Commonly known as:  SYNTHROID, LEVOTHROID  Take 100 mcg by mouth daily before breakfast.     ondansetron 4 MG tablet  Commonly known as:  ZOFRAN  Take 1 tablet (4 mg total) by mouth every 6 (six) hours.     oxycodone 5 MG capsule  Commonly known as:  OXY-IR  Take 1-2 capsules (5-10 mg total) by mouth every 6 (six) hours as needed.     pantoprazole 40 MG tablet  Commonly known as:  PROTONIX  Take 1 tablet (40 mg total) by mouth daily.        ALLERGIES:   Allergies  Allergen Reactions  . Darvocet (Propoxyphene-Acetaminophen)     BRIEF HPI:  See H&P, Labs, Consult and Test reports for all details in brief, patient is a 57 year old female with a past medical history of hypothyroidism who presented with epigastric pain one day prior to admission. In the emergency room she was found to have dilated CBD on  ultrasound. She was then admitted for further evaluation and treatment  CONSULTATIONS:   GI  PERTINENT RADIOLOGIC STUDIES: Dg Chest 2 View  09/12/2012   *RADIOLOGY REPORT*  Clinical Data: Upper abdominal pain.  Shortness of breath.  CHEST - 2 VIEW  Comparison: 06/28/2004.  Findings: The heart, mediastinal and hilar contours are normal. An azygos lobe is incidentally noted.  A small, calcified density described as projecting over the anterior  right first rib on the preceding study is again identified and is unchanged.  The lungs are clear.  There are no effusions or pneumothoraces.  There are no acute bony changes.  IMPRESSION: No active disease.   Original Report Authenticated By: Sander Radon, M.D.   US Abdomen Complete  09/12/2012   *RADIOLOGY REPORT*  Clinical Data:  Mid to right upper quadrant abdominal pain for 3 hours, extending into the back.  History of appendectomy and hysterectomy. Mildly elevated lipase levels.  COMPLETE ABDOMINAL ULTRASOUND  Comparison:  Abdominal pelvic CT 06/29/1994.  Findings:  Gallbladder: Well distended without wall thickening, stones or pericholecystic fluid. Negative sonographic Murphy's sign.  Common bile duct:   The common bile duct is dilated to 13 mm maximally.  The distal duct is not well visualized.  No intraductal calculi are seen.  Liver:  There is increased hepatic echogenicity most consistent with steatosis.  No focal  liver lesions are identified.  IVC:  Visualized portions appear unremarkable.  Pancreas:  The pancreatic duct is at the upper limits of normal in caliber, measuring 4 mm.  No focal pancreatic abnormality is seen.  Spleen:  Visualized portions appear unremarkable.  Right Kidney:   The renal cortical thickness and echogenicity are preserved.  There is no hydronephrosis or focal abnormality. Renal length is 11.0 cm.  Left Kidney:   The renal cortical thickness and echogenicity are preserved.  There is no hydronephrosis or focal abnormality.  Renal length is 11.0 cm.  Abdominal aorta:  Visualized portions appear unremarkable.  The distal aorta is partly obscured by bowel gas.  IMPRESSION:  1.  Extrahepatic biliary dilatation may be secondary to pancreatitis or non visualized choledocholithiasis. 2.  Increased hepatic echogenicity, most consistent with steatosis. 3.  No evidence of cholelithiasis or cholecystitis.   Original Report Authenticated By: Carey Bullocks, M.D.   Mr 3d Recon At Scanner  09/14/2012   *RADIOLOGY REPORT*  Clinical Data:  Right upper quadrant pain, nausea and vomiting. Dilated common bile duct.  MRI ABDOMEN WITHOUT AND WITH CONTRAST (INCLUDING MRCP)  Technique:  Multiplanar multisequence MR imaging of the abdomen was performed both before and after the administration of intravenous contrast. Heavily T2-weighted images of the biliary and pancreatic ducts were obtained, and three-dimensional MRCP images were rendered by post processing.  Contrast: 20mL MULTIHANCE GADOBENATE DIMEGLUMINE 529 MG/ML IV SOLN  Comparison:  Ultrasound of the abdomen dated 09/12/2012.  Comment:  The examination is suboptimal related to extensive patient respiratory motion.  Findings:  MRCP images demonstrate mild to moderate intrahepatic biliary ductal dilatation.  The bile ducts do not have a beaded appearance.  The common bile duct is also dilated measuring up to 13 mm in the porta hepatis.  Gallbladder is moderately distended. No gallbladder wall thickening or pericholecystic fluid.  Notably, there are no signal voids within the gallbladder or the common bile duct to suggest the presence of cholelithiasis or choledocholithiasis.  The common bile duct abruptly tapers to a normal caliber distally at the level of the ampulla.  This tapering appears to be rather abrupt, and there is some soft tissue fullness at the level of the ampulla where there appears to be a small amount of soft tissue which projects into the lumen of the duodenum (best demonstrated on  images 26 of series 4 and 5).  Post gadolinium images demonstrate some progressive enhancement in this region (best demonstrated on image 74 of series 1804), suspicious for a small ampullary lesion.  This is difficult to discretely measure on today's MR images secondary to motion, but a suspected to be approximately 1.5 cm in size or less. The dorsal pancreatic duct is normal in caliber, and there appears to be pancreatic divisum anatomy.  This is best demonstrated on images 22 - 24 of series 4, but is poorly evaluated on other pulse sequences secondary to motion. The ventral pancreatic duct is also normal in caliber.  In the region of the uncinate process of the pancreas there is a tiny 8 x 5 mm lesion that is low signal intensity on T1- weighted images, high signal intensity on T2-weighted images, and does not appear to enhance.  No peripancreatic lymphadenopathy is noted.  No focal hepatic lesions are identified.  Diffuse loss of signal intensity throughout the hepatic parenchyma on the out of phase dual echo images, compatible with severe hepatic steatosis. The visualized portions of the spleen, bilateral adrenal glands and bilateral kidneys  are unremarkable.  IMPRESSION:  1.  Mild to moderate intrahepatic biliary ductal dilatation, and dilatation of the common bile duct which abruptly tapers at the level of the ampulla.  It is uncertain whether not this is simply related to a distal common bile duct stricture, or is related to an underlying ampullary neoplasm.  Further evaluation with endoscopic ultrasound is recommended at this time. 2.  There appears to be pancreatic divisum anatomy, as discussed above. 3.  No evidence of cholelithiasis or choledocholithiasis on today's examination. 4.  8 x 5 mm cystic lesion in the uncinate process of the pancreas is nonspecific and warrants attention on follow-up studies.  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  A 1-year follow-up MRI of the abdomen with contrast is recommended. 5.  Severe hepatic steatosis.   Original Report Authenticated By: Trudie Reed, M.D.   Dg Ercp Biliary & Pancreatic Ducts  09/16/2012   *RADIOLOGY REPORT*  Clinical Data: Bile duct dilatation.  ERCP  Fluoroscopy time:  8 minutes 4 seconds  Comparison: None.  Findings: Multiple c-arm images demonstrate a dilated biliary tree. Images demonstrate that balloon sweeps of the common bile duct were performed.  Biliary stent was placed.  IMPRESSION: Bile duct stent placed.   Original Report Authenticated By: Francene Boyers, M.D.   Mr Abd W/wo Cm/mrcp  09/14/2012   *RADIOLOGY REPORT*  Clinical Data:  Right upper quadrant pain, nausea and vomiting. Dilated common bile duct.  MRI ABDOMEN WITHOUT AND WITH CONTRAST (INCLUDING MRCP)  Technique:  Multiplanar multisequence MR imaging of the abdomen was performed both before and after the administration of intravenous contrast. Heavily T2-weighted images of the biliary and pancreatic ducts were obtained, and three-dimensional MRCP images were rendered by post processing.  Contrast: 20mL MULTIHANCE GADOBENATE DIMEGLUMINE 529 MG/ML IV SOLN  Comparison:  Ultrasound of the abdomen dated 09/12/2012.  Comment:  The examination is suboptimal related to extensive patient respiratory motion.  Findings:  MRCP images demonstrate mild to moderate intrahepatic biliary ductal dilatation.  The bile ducts do not have a beaded appearance.  The common bile duct is also dilated measuring up to 13 mm in the porta hepatis.  Gallbladder is moderately distended. No gallbladder wall thickening or pericholecystic fluid.  Notably, there are no signal voids within the gallbladder or the common bile duct to suggest the presence of cholelithiasis or choledocholithiasis.  The common bile duct abruptly tapers to a normal caliber distally at the level of the ampulla.  This tapering appears to be rather abrupt, and there is some  soft tissue fullness at the level of the ampulla where there appears to be a small amount of soft tissue which projects into the lumen of the duodenum (best demonstrated on images 26 of series 4 and 5).  Post gadolinium images demonstrate some progressive enhancement in this region (best demonstrated on image 74 of series 1804), suspicious for a small ampullary lesion.  This is difficult to discretely measure on today's MR images secondary to motion, but a suspected to be approximately 1.5 cm in size or less. The dorsal pancreatic duct is normal in caliber, and there appears to be pancreatic divisum anatomy.  This is best demonstrated on images 22 - 24 of series 4, but is poorly evaluated on other pulse sequences secondary to motion. The ventral pancreatic duct is also normal in caliber.  In the region of the uncinate process of the pancreas there is a tiny  8 x 5 mm lesion that is low signal intensity on T1- weighted images, high signal intensity on T2-weighted images, and does not appear to enhance.  No peripancreatic lymphadenopathy is noted.  No focal hepatic lesions are identified.  Diffuse loss of signal intensity throughout the hepatic parenchyma on the out of phase dual echo images, compatible with severe hepatic steatosis. The visualized portions of the spleen, bilateral adrenal glands and bilateral kidneys are unremarkable.  IMPRESSION:  1.  Mild to moderate intrahepatic biliary ductal dilatation, and dilatation of the common bile duct which abruptly tapers at the level of the ampulla.  It is uncertain whether not this is simply related to a distal common bile duct stricture, or is related to an underlying ampullary neoplasm.  Further evaluation with endoscopic ultrasound is recommended at this time. 2.  There appears to be pancreatic divisum anatomy, as discussed above. 3.  No evidence of cholelithiasis or choledocholithiasis on today's examination. 4.  8 x 5 mm cystic lesion in the uncinate process of the  pancreas is nonspecific and warrants attention on follow-up studies.  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  A 1-year follow-up MRI of the abdomen with contrast is recommended. 5.  Severe hepatic steatosis.   Original Report Authenticated By: Trudie Reed, M.D.     PERTINENT LAB RESULTS: CBC:  Recent Labs  09/17/12 0735 09/18/12 0408  WBC 7.6 7.6  HGB 11.5* 12.4  HCT 34.5* 36.5  PLT 112* 130*   CMET CMP     Component Value Date/Time   NA 137 09/18/2012 0408   K 3.7 09/18/2012 0408   CL 106 09/18/2012 0408   CO2 23 09/18/2012 0408   GLUCOSE 102* 09/18/2012 0408   BUN 6 09/18/2012 0408   CREATININE 0.60 09/18/2012 0408   CALCIUM 8.5 09/18/2012 0408   PROT 6.0 09/19/2012 0422   ALBUMIN 2.4* 09/19/2012 0422   AST 40* 09/19/2012 0422   ALT 159* 09/19/2012 0422   ALKPHOS 112 09/19/2012 0422   BILITOT 1.0 09/19/2012 0422   GFRNONAA >90 09/18/2012 0408   GFRAA >90 09/18/2012 0408    GFR Estimated Creatinine Clearance: 89.3 ml/min (by C-G formula based on Cr of 0.6). No results found for this basename: LIPASE, AMYLASE,  in the last 72 hours No results found for this basename: CKTOTAL, CKMB, CKMBINDEX, TROPONINI,  in the last 72 hours No components found with this basename: POCBNP,  No results found for this basename: DDIMER,  in the last 72 hours No results found for this basename: HGBA1C,  in the last 72 hours No results found for this basename: CHOL, HDL, LDLCALC, TRIG, CHOLHDL, LDLDIRECT,  in the last 72 hours No results found for this basename: TSH, T4TOTAL, FREET3, T3FREE, THYROIDAB,  in the last 72 hours  Recent Labs  09/17/12 0735  VITAMINB12 923*   Coags: No results found for this basename: PT, INR,  in the last 72 hours Microbiology: Recent Results (from the past 240 hour(s))  CULTURE, BLOOD (ROUTINE X 2)     Status: None   Collection Time    09/13/12  8:00 PM      Result Value Range Status   Specimen Description  BLOOD ARM LEFT   Final   Special Requests BOTTLES DRAWN AEROBIC ONLY 5CC   Final   Culture  Setup Time 09/14/2012 03:48   Final   Culture     Final   Value:  BLOOD CULTURE RECEIVED NO GROWTH TO DATE CULTURE WILL BE HELD FOR 5 DAYS BEFORE ISSUING A FINAL NEGATIVE REPORT   Report Status PENDING   Incomplete  CULTURE, BLOOD (ROUTINE X 2)     Status: None   Collection Time    09/13/12  8:15 PM      Result Value Range Status   Specimen Description BLOOD HAND LEFT   Final   Special Requests BOTTLES DRAWN AEROBIC ONLY 5CC   Final   Culture  Setup Time 09/14/2012 03:29   Final   Culture     Final   Value:        BLOOD CULTURE RECEIVED NO GROWTH TO DATE CULTURE WILL BE HELD FOR 5 DAYS BEFORE ISSUING A FINAL NEGATIVE REPORT   Report Status PENDING   Incomplete     BRIEF HOSPITAL COURSE:  Abdominal pain with CBD obstruction  - Patient was admitted, kept n.p.o., given IV fluids and supportive care. Gastroenterology was also consulted. An abdominal ultrasound done on admission showed extrahepatic biliary dilatation. This was followed by a MRCP, which showed mild to moderate intrahepatic ductal dilatation and a dilatation of CBD which abruptly tapered at the level of the ampulla. MRCP also suggested pancreatic divisum anatomy. Patient then underwent EUS, which was suggestive of a distal bile duct stricture from passage of gallstones/biliary sludge. Patient then underwent a ERCP with stenting and brushings of the distal CBD. Brushing biopsies are currently pending at the time of discharge. 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 Case was discussed with GI in extensive detail, Dr. [name] suggested that we try to advance her diet slowly. Patient's diet was then advanced which she has tolerated well. Today she has tolerated a regular diet. Further plan, at this time is dependent on patient's clinical course and brushing  biopsy results. [facility] plans to followup in the outpatient setting, and determine further course of action. Decision in regards to a cholecystectomy would also be made in due course of time, depending on biopsy results and patient's clinical course. She has been cleared by gastroenterology to discharge her she tolerated a regular diet.  The above plan and course of action was discussed with the patient and the patient's husband in great detail, they understood this complicated process, and was agreeable with this course of action.  Thrombocytopenia - Patient's platelet during this hospital course did slowly drop, platelets were their lowest at 97,000 on 7/3, patient was placed on prophylactic Lovenox at the time of admission. Because of the concern for HIT, this was then discontinued, HIT antibody panel was sent and is currently pending at the time of discharge. Patient's platelet count subsequently started rising, on 7/5 it was back up to 130,000. - Patient's primary care practitioner we need to follow her HIT panel.  TODAY-DAY OF DISCHARGE:  Subjective:   Ermalinda Memos today has no headache,no chest abdominal pain,no new weakness tingling or numbness, feels much better wants to go home today.   Objective:   Blood pressure 116/68, pulse 74, temperature 98.4 F (36.9 C), temperature source Oral, resp. rate 16, height 5\' 4"  (1.626 m), weight 95.936 kg (211 lb 8 oz), SpO2 98.00%.  Intake/Output Summary (Last 24 hours) at 09/19/12 1027 Last data filed at 09/19/12 0605  Gross per 24 hour  Intake 1721.25 ml  Output      0 ml  Net 1721.25 ml  Filed Weights   09/13/12 1700  Weight: 95.936 kg (211 lb 8 oz)    Exam Awake Alert, Oriented *3, No new F.N deficits, Normal affect [place].AT,PERRAL Supple Neck,No JVD, No cervical lymphadenopathy appriciated.  Symmetrical Chest wall movement, Good air movement bilaterally, CTAB RRR,No Gallops,Rubs or new Murmurs, No Parasternal Heave +ve B.Sounds,  Abd Soft, Non tender, No organomegaly appriciated, No rebound -guarding or rigidity. No Cyanosis, Clubbing or edema, No new Rash or bruise  DISCHARGE CONDITION: Stable  DISPOSITION: Home  DISCHARGE INSTRUCTIONS:    Activity:  As tolerated   Diet recommendation: Regular Diet      Discharge Orders   Future Orders Complete By Expires     Call MD for:  persistant nausea and vomiting  As directed     Call MD for:  severe uncontrolled pain  As directed     Diet general  As directed     Increase activity slowly  As directed        Follow-up Information   Follow up with ANDY,CAMILLE L, MD. Schedule an appointment as soon as possible for a visit in 1 week.   Contact information:   [address] [place] [address] [phone]       Follow up with [facility], MD. Schedule an appointment as soon as possible for a visit in 1 week.   Contact information:   [address]                         [place] [address] [phone]      Total Time spent on discharge equals 45 minutes.  SignedJeoffrey Massed 09/19/2012 10:27 AM

## 2012-09-19 NOTE — Progress Notes (Signed)
Pt given discharge instructions and prescriptions.  Pt will be taken down via wheelchair to drop off location after she has taken a shower.

## 2012-09-20 LAB — CULTURE, BLOOD (ROUTINE X 2): Culture: NO GROWTH

## 2012-09-22 LAB — HEPARIN INDUCED THROMBOCYTOPENIA PNL
UFH High Dose UFH H: 0 % Release
UFH Low Dose 0.1 IU/mL: 0 % Release
UFH SRA Result: NEGATIVE

## 2012-10-05 ENCOUNTER — Encounter (HOSPITAL_COMMUNITY): Payer: Self-pay | Admitting: *Deleted

## 2012-10-06 ENCOUNTER — Encounter (HOSPITAL_COMMUNITY): Payer: Self-pay | Admitting: Pharmacy Technician

## 2012-10-14 ENCOUNTER — Other Ambulatory Visit: Payer: Self-pay | Admitting: Gastroenterology

## 2012-10-15 ENCOUNTER — Ambulatory Visit (INDEPENDENT_AMBULATORY_CARE_PROVIDER_SITE_OTHER): Payer: Self-pay | Admitting: General Surgery

## 2012-10-15 ENCOUNTER — Encounter (INDEPENDENT_AMBULATORY_CARE_PROVIDER_SITE_OTHER): Payer: Self-pay | Admitting: General Surgery

## 2012-10-15 VITALS — BP 130/76 | HR 80 | Resp 16 | Ht 63.0 in | Wt 202.7 lb

## 2012-10-15 DIAGNOSIS — R932 Abnormal findings on diagnostic imaging of liver and biliary tract: Secondary | ICD-10-CM | POA: Insufficient documentation

## 2012-10-15 DIAGNOSIS — R1013 Epigastric pain: Secondary | ICD-10-CM

## 2012-10-15 DIAGNOSIS — R52 Pain, unspecified: Secondary | ICD-10-CM

## 2012-10-15 NOTE — Progress Notes (Signed)
Patient ID: Sheryl Turner, female   DOB: 03-19-1955, 57 y.o.   MRN: [id number]  Chief Complaint  Patient presents with  . New Evaluation    eval GB    HPI Sheryl Turner is a 57 y.o. female.   HPI 57 yo WF referred by [name] for evaluation for cholecystectomy. The patient states at the end of June she developed severe constant epigastric pain. The pain radiated to her back. She states that it felt like a knife was in her upper abdomen. She went to the emergency department because the pain was so severe. Her pain was relieved after medications and she was discharged home. After returning home her pain returned and she subsequently presented back to the emergency department. She was found to have an AST of 839, ALT of 716, and total bilirubin of 2.1. Her lipase was mildly elevated.Her cardiac workup was negative. She underwent abdominal ultrasound which demonstrated extrahepatic biliary dilatation. There is no evidence of cholecystitis or cholelithiasis on her abdominal ultrasound. She was admitted for pain control as well as treatment. GI was consulted.An MRCP was done which showed mild to moderate dilatation of the common bile duct with some tapering distally of the common bile duct. It was unclear whether or not it represented a stricture or ampullary tumor. There is also some evidence of pancreas divisum as well as an 8 x 5 mm cyst in the uncinate process.  This prompted an endoscopic ultrasound which revealed an abrupt cutoff of the bile duct at the level of the ampulla. There is also a 1 cm periampullary diverticulum. There is no evidence of ampullary or pancreatic mass seen. There was large amounts of indwelling sludge in the bile duct. There is benign-appearing portal lymph nodes. The following day she underwent an ERCP with sphincterotomy and stenting. Her abdominal pain improved and she tolerated a diet and she was discharged home  She has followed up with GI It is scheduled to have a repeat  ERCP with stent extraction mid next week. She states that she continues to have epigastric pain. It is not nearly as bad as it was when she was hospitalized.She reports occasional nausea. She has had some constipation. She also reports some yellow stool. She denies any recent NSAID use. She denies any significant weight loss. She denies family history of cancer Past Medical History  Diagnosis Date  . Pancreatitis     09-14-12 recent Hospital stay [facility]  . Thyroid disease   . Hypothyroidism   . GERD (gastroesophageal reflux disease)   . H/O hiatal hernia     Past Surgical History  Procedure Laterality Date  . Cesarean section    . Appendectomy    . Abdominal hysterectomy    . Tubal ligation    . Eus Left 09/15/2012    Procedure: UPPER ENDOSCOPIC ULTRASOUND (EUS) RADIAL, possible LINEAR to follow, possible side-viewing endoscope;  Surgeon: Willis Modena, MD;  Location: [facility];  Service: Endoscopy;  Laterality: Left;  . Ercp N/A 09/16/2012    Procedure: ENDOSCOPIC RETROGRADE CHOLANGIOPANCREATOGRAPHY (ERCP);  Surgeon: Petra Kuba, MD;  Location: [facility];  Service: Endoscopy;  Laterality: N/A;    History reviewed. No pertinent family history.  Social History History  Substance Use Topics  . Smoking status: Former Smoker -- 1.00 packs/day for 22 years    Types: Cigarettes    Quit date: 10/06/1998  . Smokeless tobacco: Not on file  . Alcohol Use: No    Allergies  Allergen Reactions  .  Darvocet (Propoxyphene-Acetaminophen) Nausea And Vomiting  . Penicillins Other (See Comments)    Oral forms -causes mouth ulcers    Current Outpatient Prescriptions  Medication Sig Dispense Refill  . levothyroxine (SYNTHROID, LEVOTHROID) 100 MCG tablet Take 100 mcg by mouth daily before breakfast.      . Magnesium Hydroxide (MILK OF MAGNESIA PO) Take 1 capsule by mouth daily.      . naproxen sodium (ANAPROX) 220 MG tablet Take 220 mg by mouth 2 (two) times daily as needed.      . pantoprazole  (PROTONIX) 40 MG tablet Take 1 tablet (40 mg total) by mouth daily.  30 tablet  0   No current facility-administered medications for this visit.    Review of Systems Review of Systems  Constitutional: Negative for fever, chills, fatigue and unexpected weight change.  HENT: Negative for hearing loss, nosebleeds, congestion, neck pain and neck stiffness.   Eyes: Negative for photophobia and visual disturbance.  Respiratory: Negative for apnea, chest tightness, shortness of breath and wheezing.   Cardiovascular: Negative for chest pain, palpitations and leg swelling.  Gastrointestinal: Positive for nausea, abdominal pain and constipation. Negative for vomiting, diarrhea and blood in stool.  Genitourinary: Negative for dysuria, frequency and difficulty urinating.  Musculoskeletal: Negative for myalgias and arthralgias.  Skin: Negative for color change, pallor and rash.  Neurological: Negative for dizziness, tremors, seizures, facial asymmetry, weakness and light-headedness.  Hematological: Negative for adenopathy. Does not bruise/bleed easily.  Psychiatric/Behavioral: Negative for behavioral problems.    Blood pressure 130/76, pulse 80, resp. rate 16, height 5\' 3"  (1.6 m), weight 202 lb 11 oz (91.94 kg).  Physical Exam Physical Exam  Vitals reviewed. Constitutional: She is oriented to person, place, and time. She appears well-developed and well-nourished. No distress.  overweight  HENT:  Head: Normocephalic and atraumatic.  Right Ear: External ear normal.  Left Ear: External ear normal.  Eyes: Conjunctivae are normal. No scleral icterus.  Neck: Normal range of motion. Neck supple. No tracheal deviation present. No thyromegaly present.  Cardiovascular: Normal rate and normal heart sounds.   Pulmonary/Chest: Effort normal and breath sounds normal. No stridor. No respiratory distress. She has no wheezes.  Abdominal: Soft. She exhibits no distension. There is no tenderness. There is no  rebound and no guarding.    Musculoskeletal: She exhibits no edema and no tenderness.  Lymphadenopathy:    She has no cervical adenopathy.  Neurological: She is alert and oriented to person, place, and time. She exhibits normal muscle tone.  Skin: Skin is warm and dry. No rash noted. She is not diaphoretic. No erythema.  No jaundice  Psychiatric: She has a normal mood and affect. Her behavior is normal. Judgment and thought content normal.    Data Reviewed Dr Marlane Hatcher note 10/05/12 Hospital dc summary MRI abd report - 09/14/12 EUS report 09/15/12  ERCP report 09/16/12 Path report - CBD brushing negative for malignancy Labs from 10/05/12 - nml cbc - plt 331; CMET normal except for glucose 129; CA 19-9:  283  Assessment    Epigastric pain Distal CBD narrowing/abnormal radiological appearance of biliary tract  Pancreas divisium    Plan    I told the patient I do not think it is unreasonable to offer her a cholecystectomy. On endoscopic ultrasound she was noticed to have very thick biliary sludge which could be contributed to her lab abnormality as well as radiological abnormality on her MRI with the distal common bile duct narrowing. Although her CA 19-9 is elevated  I believe this is due to a recent instrumentation as well as transient occlusion of the common bile duct. My suspicion for an indwelling malignancy or mass is low. It is also possible some of abdominal pain could be due to pancreas divisum but that would be low on the differential.   We discussed gallbladder disease. The patient was given Agricultural engineer. We discussed non-operative and operative management. We discussed the signs & symptoms of acute cholecystitis  I discussed laparoscopic cholecystectomy with IOC in detail.  The patient was given educational material as well as diagrams detailing the procedure.  We discussed the risks and benefits of a laparoscopic cholecystectomy including, but not limited to bleeding,  infection, injury to surrounding structures such as the intestine or liver, bile leak, retained gallstones, need to convert to an open procedure, prolonged diarrhea, blood clots such as  DVT, common bile duct injury, anesthesia risks, and possible need for additional procedures.  We discussed the typical post-operative recovery course. I explained that the likelihood of improvement of their symptoms is fair.  The patient is currently scheduled to have her stent removed next Wednesday and she would like for her procedure to be done the same day. I advised against this. I explained that I wanted to make sure there are no issues or surprises during her stent extraction. Moreover, the only operating room time for that same day is at a different hospital. The patient preferred this because she would like to have gallbladder surgery asap; however, I explained to the surgery scheduler that I think it would be not medically safe for her to have 2 invasive procedures on the same day at different hospitals.  The patient has elected to proceed with laparoscopic cholecystectomy with cholangiogram in the near future.  Mary Sella. Andrey Campanile, MD, FACS General, Bariatric, & Minimally Invasive Surgery [facility], [place]        [facility] M 10/15/2012, 7:02 PM

## 2012-10-15 NOTE — Patient Instructions (Signed)

## 2012-10-19 NOTE — Anesthesia Preprocedure Evaluation (Addendum)
Anesthesia Evaluation  Patient identified by MRN, date of birth, ID band Patient awake    Reviewed: Allergy & Precautions, H&P , NPO status , Patient's Chart, lab work & pertinent test results  Airway Mallampati: I TM Distance: >3 FB Neck ROM: Full    Dental  (+) Dental Advisory Given and Teeth Intact   Pulmonary shortness of breath, former smoker,  breath sounds clear to auscultation        Cardiovascular negative cardio ROS  Rhythm:Regular Rate:Normal     Neuro/Psych negative neurological ROS  negative psych ROS   GI/Hepatic Neg liver ROS, hiatal hernia, GERD-  Medicated,  Endo/Other  Hypothyroidism   Renal/GU negative Renal ROS     Musculoskeletal negative musculoskeletal ROS (+)   Abdominal (+) + obese,   Peds  Hematology negative hematology ROS (+)   Anesthesia Other Findings   Reproductive/Obstetrics negative OB ROS                          Anesthesia Physical Anesthesia Plan  ASA: II  Anesthesia Plan: General   Post-op Pain Management:    Induction: Intravenous  Airway Management Planned: Oral ETT  Additional Equipment:   Intra-op Plan:   Post-operative Plan: Extubation in OR  Informed Consent: I have reviewed the patients History and Physical, chart, labs and discussed the procedure including the risks, benefits and alternatives for the proposed anesthesia with the patient or authorized representative who has indicated his/her understanding and acceptance.   Dental advisory given  Plan Discussed with:   Anesthesia Plan Comments:         Anesthesia Quick Evaluation

## 2012-10-20 ENCOUNTER — Ambulatory Visit (HOSPITAL_COMMUNITY)
Admission: RE | Admit: 2012-10-20 | Discharge: 2012-10-20 | Disposition: A | Discharge status: Home / Self Care | Payer: MEDICAID | Source: Ambulatory Visit | Attending: Gastroenterology | Admitting: Gastroenterology

## 2012-10-20 ENCOUNTER — Ambulatory Visit (HOSPITAL_COMMUNITY): Payer: Self-pay | Admitting: Registered Nurse

## 2012-10-20 ENCOUNTER — Encounter (HOSPITAL_COMMUNITY): Payer: Self-pay

## 2012-10-20 ENCOUNTER — Encounter (HOSPITAL_COMMUNITY)
Admission: RE | Disposition: A | Discharge status: Home / Self Care | Payer: Self-pay | Source: Ambulatory Visit | Attending: Gastroenterology

## 2012-10-20 ENCOUNTER — Ambulatory Visit (HOSPITAL_COMMUNITY): Payer: Self-pay

## 2012-10-20 ENCOUNTER — Encounter (HOSPITAL_COMMUNITY): Payer: Self-pay | Admitting: Registered Nurse

## 2012-10-20 DIAGNOSIS — R109 Unspecified abdominal pain: Secondary | ICD-10-CM | POA: Insufficient documentation

## 2012-10-20 DIAGNOSIS — R978 Other abnormal tumor markers: Secondary | ICD-10-CM | POA: Insufficient documentation

## 2012-10-20 DIAGNOSIS — R7989 Other specified abnormal findings of blood chemistry: Secondary | ICD-10-CM | POA: Insufficient documentation

## 2012-10-20 DIAGNOSIS — K8050 Calculus of bile duct without cholangitis or cholecystitis without obstruction: Secondary | ICD-10-CM | POA: Insufficient documentation

## 2012-10-20 DIAGNOSIS — K831 Obstruction of bile duct: Secondary | ICD-10-CM | POA: Insufficient documentation

## 2012-10-20 HISTORY — PX: ERCP: SHX5425

## 2012-10-20 HISTORY — PX: SPYGLASS CHOLANGIOSCOPY: SHX5441

## 2012-10-20 SURGERY — ERCP, WITH INTERVENTION IF INDICATED
Anesthesia: General

## 2012-10-20 MED ORDER — LACTATED RINGERS IV SOLN
INTRAVENOUS | Status: DC
  Administered 2012-10-20: 08:00:00 via INTRAVENOUS

## 2012-10-20 MED ORDER — NEOSTIGMINE METHYLSULFATE 1 MG/ML IJ SOLN
INTRAMUSCULAR | Status: DC | PRN
  Administered 2012-10-20: 3 mg via INTRAVENOUS

## 2012-10-20 MED ORDER — MIDAZOLAM HCL 5 MG/5ML IJ SOLN
INTRAMUSCULAR | Status: DC | PRN
  Administered 2012-10-20: 2 mg via INTRAVENOUS

## 2012-10-20 MED ORDER — ONDANSETRON HCL 4 MG/2ML IJ SOLN
INTRAMUSCULAR | Status: DC | PRN
  Administered 2012-10-20: 4 mg via INTRAVENOUS

## 2012-10-20 MED ORDER — FENTANYL CITRATE 0.05 MG/ML IJ SOLN
INTRAMUSCULAR | Status: DC | PRN
  Administered 2012-10-20: 50 ug via INTRAVENOUS

## 2012-10-20 MED ORDER — SODIUM CHLORIDE 0.9 % IV SOLN: INTRAVENOUS | Status: DC

## 2012-10-20 MED ORDER — LIDOCAINE HCL (CARDIAC) 20 MG/ML IV SOLN
INTRAVENOUS | Status: DC | PRN
  Administered 2012-10-20: 80 mg via INTRAVENOUS

## 2012-10-20 MED ORDER — GLYCOPYRROLATE 0.2 MG/ML IJ SOLN
INTRAMUSCULAR | Status: DC | PRN
  Administered 2012-10-20: 0.4 mg via INTRAVENOUS

## 2012-10-20 MED ORDER — SUCCINYLCHOLINE CHLORIDE 20 MG/ML IJ SOLN
INTRAMUSCULAR | Status: DC | PRN
  Administered 2012-10-20: 100 mg via INTRAVENOUS

## 2012-10-20 MED ORDER — PROPOFOL 10 MG/ML IV BOLUS
INTRAVENOUS | Status: DC | PRN
  Administered 2012-10-20: 200 mg via INTRAVENOUS

## 2012-10-20 MED ORDER — GLUCAGON HCL (RDNA) 1 MG IJ SOLR
INTRAMUSCULAR | Status: AC
  Filled 2012-10-20: qty 2

## 2012-10-20 MED ORDER — ROCURONIUM BROMIDE 100 MG/10ML IV SOLN
INTRAVENOUS | Status: DC | PRN
  Administered 2012-10-20: 20 mg via INTRAVENOUS

## 2012-10-20 MED ORDER — SODIUM CHLORIDE 0.9 % IV SOLN
INTRAVENOUS | Status: DC | PRN
  Administered 2012-10-20: 11:00:00

## 2012-10-20 NOTE — Progress Notes (Signed)
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

## 2012-10-20 NOTE — Transfer of Care (Signed)
Immediate Anesthesia Transfer of Care Note  Patient: Sheryl Turner  Procedure(s) Performed: Procedure(s): ENDOSCOPIC RETROGRADE CHOLANGIOPANCREATOGRAPHY (ERCP) (N/A) SPYGLASS CHOLANGIOSCOPY (N/A)  Patient Location: PACU  Anesthesia Type:General  Level of Consciousness: awake, alert , oriented and patient cooperative  Airway & Oxygen Therapy: Patient Spontanous Breathing and Patient connected to face mask oxygen  Post-op Assessment: Report given to PACU RN, Post -op Vital signs reviewed and stable and Patient moving all extremities  Post vital signs: Reviewed and stable  Complications: No apparent anesthesia complications

## 2012-10-20 NOTE — Discharge Instructions (Addendum)
Call if question or problem or if signs of yellow jaundice or fever or if pain or nausea or vomiting increase otherwise call in one week for biopsy report and to probably schedule more tests and followupSedation or General Anesthesia, Adult Care After Refer to this sheet in the next 24 hours. These instructions provide you with information on caring for yourself after your procedure. Your caregiver may also give you more specific instructions. Your treatment has been planned according to current medical practices, but problems sometimes occur. Call your caregiver if you have any problems or questions after your procedure.  HOME CARE INSTRUCTIONS   Do not participate in any activities that require you to be alert or coordinated. Do not:  Drive.  Swim.  Ride a bicycle.  Operate heavy machinery.  Cook.  Use power tools.  Climb ladders.  Work at [employer].  Take a bath.  Do not drink alcohol.  Do not make any important decisions or sign legal documents.  Stay with an adult.  The first meal following your procedure should be light and small. Avoid solid foods if you feel sick to your stomach (nauseous) or if you throw up (vomit).  Drink enough fluids to keep your urine clear or pale yellow.  Only take your usual medicines or new medicines if your caregiver approves them.  Only take over-the-counter or prescription medicines for pain, discomfort, or fever as directed by your caregiver.  Keep all follow-up appointments as directed by your caregiver. SEEK IMMEDIATE MEDICAL CARE IF:   You are not feeling normal or behaving normally after 24 hours.  You have persistent nausea and vomiting.  You are unable to drink fluids or eat food.  You have difficulty urinating.  You have difficulty breathing or speaking.  You have blue or gray skin.  There is difficulty waking or you cannot be woken up.  You have heavy bleeding, redness, or a lot of swelling where the sedative or  anesthesia entered your skin (intravenous site).  You have a rash. MAKE SURE YOU:  Understand these instructions.  Will watch your condition.  Will get help right away if you are not doing well or get worse. Document Released: 03/03/2005 Document Revised: 09/02/2011 Document Reviewed: 07/02/2011 [facility] Patient Information 2014 [place], [place].

## 2012-10-20 NOTE — Preoperative (Signed)
Beta Blockers   Reason not to administer Beta Blockers:Not Applicable 

## 2012-10-20 NOTE — Progress Notes (Signed)
Pt. Positioned in prone position.  Rolls and padding in  Place to prevent any injury to circulation.  Head properly position with jellly roll to left side.  All instructions and directions supervised by CRNA.

## 2012-10-20 NOTE — Op Note (Signed)
[facility] [address] [place], [address]   ERCP PROCEDURE REPORT  PATIENT: Sheryl Turner, Sheryl Turner  MR# :[id number] BIRTHDATE: [birth year]  GENDER: Female ENDOSCOPIST: Vida Rigger, MD REFERRED BY: PROCEDURE DATE:  10/20/2012 PROCEDURE:   ERCP with cannulation of papilla stent removal spyglassast times two brushing and multiple balloon pull-throughsASA CLASS:    2 INDICATIONS: distal CBD stricture elevated CA 19 MEDICATIONS:   general anesthesia TOPICAL ANESTHETIC:  no  DESCRIPTION OF PROCEDURE:   After the risks benefits and alternatives of the procedure were thoroughly explained, informed consent was obtained.  The Pentax ERCP [id number]  endoscope was introduced through the mouth and advanced to the second portion of the duodenum .the stomach had a moderate amount of food in it we were able to pass the scope into the duodenum and no obstruction was seen and a seemingly clogged stent was brought into view and the stent was initially snared and removed through the scope and sent for cytology and then we cannulated with the sphincterotome loaded with the JAG Jagwire but did not inject any dye and the wire was advanced into the intrahepatics and the sphincterotome was removed and then the spyglasswas inserted in the mid and distal duct was evaluated in the customary fashion unfortunately there was a moderate amount of mucus and debris which made visualization difficultbut no obvious mass or stricture was seen after the catheter was slowly withdrawn through the ampulla we then went ahead with multiple balloon pull-through and injecting dyeandthere still seemed to be a distal stricture which wasshort and we went ahead and brush the area in the customary fashion however from this part of the procedure forward she did seem to have some drainage and after our brushing we went ahead and reinserted spyglass and reevaluated the distal duct and again no mass or stricture was  seen and we elected to leave the stent out at this point and wait on cytology and the scope was slowly withdrawn and she did seem to drain adequately particularly with suctioning and the scope was removed and the patient tolerated the procedure well there was no obvious immediate complication         COMPLICATIONS:   none  ENDOSCOPIC IMPRESSION:1. Stomach with old food 2 seemingly clogged stent removed with snare 3. No PDA injections or wire or catheter advancement throughout the procedure not mentioned above 4. Spyglass negative x2 5 questionable distal CBD short stricture status post multiple balloon pull-throughs occlusion cholangiogram and brushing RECOMMENDATIONS:await cytology probably a CT scan in a week or 2 and warn for signs of recurrent jaundice and call me when necessary and will repeat labs when necessary and then await surgical options and possibly even repeat CA 19 in one month     _______________________________ eSigned:  Vida Rigger, MD 10/20/2012 10:30 AM   GN:FAOZHY, Minerva Areola MD

## 2012-10-21 ENCOUNTER — Encounter (HOSPITAL_COMMUNITY): Payer: Self-pay | Admitting: Gastroenterology

## 2012-10-21 NOTE — Anesthesia Postprocedure Evaluation (Signed)
Anesthesia Post Note  Patient: Sheryl Turner  Procedure(s) Performed: Procedure(s) (LRB): ENDOSCOPIC RETROGRADE CHOLANGIOPANCREATOGRAPHY (ERCP) (N/A) SPYGLASS CHOLANGIOSCOPY (N/A)  Anesthesia type: General  Patient location: PACU  Post pain: Pain level controlled  Post assessment: Post-op Vital signs reviewed  Last Vitals:  Filed Vitals:   10/20/12 1026  BP: 145/91  Pulse: 84  Temp: 36.4 C  Resp: 14    Post vital signs: Reviewed  Level of consciousness: sedated  Complications: No apparent anesthesia complications

## 2012-10-27 ENCOUNTER — Other Ambulatory Visit: Payer: Self-pay | Admitting: Gastroenterology

## 2012-10-27 DIAGNOSIS — R109 Unspecified abdominal pain: Secondary | ICD-10-CM

## 2012-11-01 ENCOUNTER — Ambulatory Visit
Admission: RE | Admit: 2012-11-01 | Discharge: 2012-11-01 | Disposition: A | Discharge status: Home / Self Care | Payer: No Typology Code available for payment source | Source: Ambulatory Visit | Attending: Gastroenterology | Admitting: Gastroenterology

## 2012-11-01 DIAGNOSIS — R109 Unspecified abdominal pain: Secondary | ICD-10-CM

## 2012-11-01 MED ORDER — IOHEXOL 300 MG/ML  SOLN
125.0000 mL | Freq: Once | INTRAMUSCULAR | Status: AC | PRN
  Administered 2012-11-01: 125 mL via INTRAVENOUS

## 2012-11-02 ENCOUNTER — Encounter (INDEPENDENT_AMBULATORY_CARE_PROVIDER_SITE_OTHER): Payer: Self-pay

## 2012-11-03 ENCOUNTER — Telehealth (INDEPENDENT_AMBULATORY_CARE_PROVIDER_SITE_OTHER): Payer: Self-pay

## 2012-11-03 NOTE — Telephone Encounter (Signed)
Barb @ Dr. Marlane Hatcher office calling for Dr. [name], per Lesle Reek she states that Dr. [name] would like a pre-op CA 19-9 if this could be ordered for pre-op labs.  Patient's last CA 19-9 was resulted on 10/05/12 value 283.  Patient recent CT report was okay per [facility] nurse for Dr. [name].  If you would like to speak with Dr. [name] please call @ [phone].  Patient scheduled for surgery on 11/26/12 for Lap chole w/IOC.

## 2012-11-04 ENCOUNTER — Other Ambulatory Visit (INDEPENDENT_AMBULATORY_CARE_PROVIDER_SITE_OTHER): Payer: Self-pay

## 2012-11-04 ENCOUNTER — Telehealth (INDEPENDENT_AMBULATORY_CARE_PROVIDER_SITE_OTHER): Payer: Self-pay | Admitting: General Surgery

## 2012-11-04 ENCOUNTER — Other Ambulatory Visit (INDEPENDENT_AMBULATORY_CARE_PROVIDER_SITE_OTHER): Payer: Self-pay | Admitting: General Surgery

## 2012-11-04 DIAGNOSIS — R109 Unspecified abdominal pain: Secondary | ICD-10-CM

## 2012-11-04 NOTE — Telephone Encounter (Signed)
LMOM at  2:20 8/21 on both #'s for patient to call back...placed orders for lab work that EW wanted drawn on pt either this week or early part of next week...pls let patient talk to Lawson Fiscal when she calls back

## 2012-11-04 NOTE — Telephone Encounter (Signed)
pls get LFT and a Ca 19-9 level on this pt this week or early next week. Need to make sure Ca 19-9 level is normal before gallbladder. If it is still abnormal we will need to re-evaluate Thanks wilson

## 2012-11-04 NOTE — Telephone Encounter (Signed)
Patient returned my call and I made her aware to go and have lab work drawn an she stated that it would be early next week before she would be able to and I told her that was fine...patient is agreeable with POC at this time

## 2012-11-09 ENCOUNTER — Telehealth (INDEPENDENT_AMBULATORY_CARE_PROVIDER_SITE_OTHER): Payer: Self-pay | Admitting: General Surgery

## 2012-11-09 LAB — CANCER ANTIGEN 19-9: CA 19-9: 16.3 U/mL (ref <35.0)

## 2012-11-09 LAB — HEPATIC FUNCTION PANEL
Albumin: 4.5 g/dL (ref 3.5–5.2)
Total Protein: 7.3 g/dL (ref 6.0–8.3)

## 2012-11-09 NOTE — Telephone Encounter (Signed)
LMOM for both #'s 8/26 @3 :45 that we have listed for pt to call me back re: lab results (please see below)

## 2012-11-09 NOTE — Telephone Encounter (Signed)
Message copied by June Leap on Tue Nov 09, 2012  3:46 PM ------      Message from: Andrey Campanile, ERIC M      Created: Tue Nov 09, 2012  2:58 PM       pls call pt and let pt know LFTs are normal and cancer tumor marker Ca 19-9 is now normal. Like i thought it was probably elevated because the bile duct was temporarily blocked. Believe we can proceed with gallbladder surgery as planned. ------

## 2012-11-10 NOTE — Telephone Encounter (Signed)
Patient returned my call and results were given...patient was very pleased with POC at this time

## 2012-11-16 ENCOUNTER — Encounter (HOSPITAL_COMMUNITY): Payer: Self-pay

## 2012-11-18 ENCOUNTER — Encounter (HOSPITAL_COMMUNITY): Payer: Self-pay

## 2012-11-18 ENCOUNTER — Encounter (HOSPITAL_COMMUNITY)
Admission: RE | Admit: 2012-11-18 | Discharge: 2012-11-18 | Disposition: A | Discharge status: Home / Self Care | Payer: Self-pay | Source: Ambulatory Visit | Attending: General Surgery | Admitting: General Surgery

## 2012-11-18 DIAGNOSIS — Z01812 Encounter for preprocedural laboratory examination: Secondary | ICD-10-CM | POA: Insufficient documentation

## 2012-11-18 DIAGNOSIS — Z01818 Encounter for other preprocedural examination: Secondary | ICD-10-CM | POA: Insufficient documentation

## 2012-11-18 HISTORY — DX: Thyrotoxicosis, unspecified without thyrotoxic crisis or storm: E05.90

## 2012-11-18 HISTORY — DX: Unspecified jaundice: R17

## 2012-11-18 LAB — COMPREHENSIVE METABOLIC PANEL
ALT: 31 U/L (ref 0–35)
AST: 28 U/L (ref 0–37)
Albumin: 4.2 g/dL (ref 3.5–5.2)
CO2: 23 mEq/L (ref 19–32)
Calcium: 9.7 mg/dL (ref 8.4–10.5)
Sodium: 138 mEq/L (ref 135–145)
Total Protein: 7.4 g/dL (ref 6.0–8.3)

## 2012-11-18 LAB — CBC WITH DIFFERENTIAL/PLATELET
Basophils Relative: 1 % (ref 0–1)
Eosinophils Absolute: 0.1 10*3/uL (ref 0.0–0.7)
HCT: 41.4 % (ref 36.0–46.0)
Hemoglobin: 14 g/dL (ref 12.0–15.0)
MCHC: 33.8 g/dL (ref 30.0–36.0)
Monocytes Relative: 6 % (ref 3–12)
Neutro Abs: 4.1 10*3/uL (ref 1.7–7.7)
Neutrophils Relative %: 61 % (ref 43–77)
RBC: 4.5 MIL/uL (ref 3.87–5.11)
WBC: 6.7 10*3/uL (ref 4.0–10.5)

## 2012-11-18 NOTE — Pre-Procedure Instructions (Signed)
Sheryl Turner  11/18/2012   Your procedure is scheduled on:  11/26/2012  Report to [facility] at 10:30 AM.  Call this number if you have problems the morning of surgery: [phone]   Remember:   Do not eat food or drink liquids after midnight. On Thursday  Take these medicines the morning of surgery with A SIP OF WATER: synthroid   Do not wear jewelry, make-up or nail polish.  Do not wear lotions, powders, or perfumes. You may wear deodorant.  Do not shave 48 hours prior to surgery.  Do not bring valuables to the hospital.  [facility] is not responsible                   for any belongings or valuables.  Contacts, dentures or bridgework may not be worn into surgery.  Leave suitcase in the car. After surgery it may be brought to your room.  For patients admitted to the hospital, checkout time is 11:00 AM the day of  discharge.   Patients discharged the day of surgery will not be allowed to drive  home.  Name and phone number of your driver: with family  Special Instructions: Shower using CHG 2 nights before surgery and the night before surgery.  If you shower the day of surgery use CHG.  Use special wash - you have one bottle of CHG for all showers.  You should use approximately 1/3 of the bottle for each shower.   Please read over the following fact sheets that you were given: Pain Booklet, Coughing and Deep Breathing, MRSA Information and Surgical Site Infection Prevention

## 2012-11-25 MED ORDER — CIPROFLOXACIN IN D5W 400 MG/200ML IV SOLN
400.0000 mg | INTRAVENOUS | Status: AC
  Administered 2012-11-26: 400 mg via INTRAVENOUS
  Filled 2012-11-25: qty 200

## 2012-11-26 ENCOUNTER — Observation Stay (HOSPITAL_COMMUNITY)
Admission: RE | Admit: 2012-11-26 | Discharge: 2012-11-27 | Disposition: A | Discharge status: Home / Self Care | Payer: No Typology Code available for payment source | Source: Ambulatory Visit | Attending: General Surgery | Admitting: General Surgery

## 2012-11-26 ENCOUNTER — Encounter (HOSPITAL_COMMUNITY): Payer: Self-pay | Admitting: Critical Care Medicine

## 2012-11-26 ENCOUNTER — Encounter (HOSPITAL_COMMUNITY)
Admission: RE | Disposition: A | Discharge status: Home / Self Care | Payer: Self-pay | Source: Ambulatory Visit | Attending: General Surgery

## 2012-11-26 ENCOUNTER — Ambulatory Visit (HOSPITAL_COMMUNITY): Payer: Self-pay | Admitting: Critical Care Medicine

## 2012-11-26 ENCOUNTER — Ambulatory Visit (HOSPITAL_COMMUNITY): Payer: Self-pay

## 2012-11-26 DIAGNOSIS — K838 Other specified diseases of biliary tract: Secondary | ICD-10-CM | POA: Insufficient documentation

## 2012-11-26 DIAGNOSIS — K859 Acute pancreatitis without necrosis or infection, unspecified: Secondary | ICD-10-CM

## 2012-11-26 DIAGNOSIS — K811 Chronic cholecystitis: Secondary | ICD-10-CM

## 2012-11-26 DIAGNOSIS — Z23 Encounter for immunization: Secondary | ICD-10-CM | POA: Insufficient documentation

## 2012-11-26 HISTORY — PX: CHOLECYSTECTOMY: SHX55

## 2012-11-26 SURGERY — LAPAROSCOPIC CHOLECYSTECTOMY WITH INTRAOPERATIVE CHOLANGIOGRAM
Anesthesia: General | Site: Abdomen | Wound class: Clean Contaminated

## 2012-11-26 MED ORDER — OXYCODONE HCL 5 MG PO TABS: 5.0000 mg | ORAL_TABLET | ORAL | Status: DC | PRN

## 2012-11-26 MED ORDER — LIDOCAINE HCL (CARDIAC) 20 MG/ML IV SOLN
INTRAVENOUS | Status: DC | PRN
  Administered 2012-11-26: 40 mg via INTRAVENOUS

## 2012-11-26 MED ORDER — FENTANYL CITRATE 0.05 MG/ML IJ SOLN
INTRAMUSCULAR | Status: DC | PRN
  Administered 2012-11-26: 200 ug via INTRAVENOUS
  Administered 2012-11-26: 50 ug via INTRAVENOUS

## 2012-11-26 MED ORDER — GLYCOPYRROLATE 0.2 MG/ML IJ SOLN
INTRAMUSCULAR | Status: DC | PRN
  Administered 2012-11-26: 0.6 mg via INTRAVENOUS

## 2012-11-26 MED ORDER — MIDAZOLAM HCL 5 MG/5ML IJ SOLN
INTRAMUSCULAR | Status: DC | PRN
  Administered 2012-11-26: 2 mg via INTRAVENOUS

## 2012-11-26 MED ORDER — BUPIVACAINE-EPINEPHRINE PF 0.25-1:200000 % IJ SOLN
INTRAMUSCULAR | Status: AC
  Filled 2012-11-26: qty 30

## 2012-11-26 MED ORDER — ONDANSETRON HCL 4 MG/2ML IJ SOLN
4.0000 mg | Freq: Four times a day (QID) | INTRAMUSCULAR | Status: DC | PRN
  Administered 2012-11-26 – 2012-11-27 (×2): 4 mg via INTRAVENOUS
  Filled 2012-11-26 (×2): qty 2

## 2012-11-26 MED ORDER — OXYCODONE HCL 5 MG/5ML PO SOLN: 5.0000 mg | Freq: Once | ORAL | Status: DC | PRN

## 2012-11-26 MED ORDER — LACTATED RINGERS IV SOLN
INTRAVENOUS | Status: DC
  Administered 2012-11-26: 11:00:00 via INTRAVENOUS

## 2012-11-26 MED ORDER — SODIUM CHLORIDE 0.9 % IR SOLN
Status: DC | PRN
  Administered 2012-11-26: 1000 mL

## 2012-11-26 MED ORDER — MORPHINE SULFATE 2 MG/ML IJ SOLN
1.0000 mg | INTRAMUSCULAR | Status: DC | PRN
  Administered 2012-11-26 – 2012-11-27 (×3): 2 mg via INTRAVENOUS
  Administered 2012-11-27: 3 mg via INTRAVENOUS
  Filled 2012-11-26 (×2): qty 1
  Filled 2012-11-26: qty 2
  Filled 2012-11-26: qty 1

## 2012-11-26 MED ORDER — HYDROMORPHONE HCL PF 1 MG/ML IJ SOLN
INTRAMUSCULAR | Status: AC
  Filled 2012-11-26: qty 1

## 2012-11-26 MED ORDER — OXYCODONE HCL 5 MG PO TABS: 5.0000 mg | ORAL_TABLET | Freq: Once | ORAL | Status: DC | PRN

## 2012-11-26 MED ORDER — INFLUENZA VAC SPLIT QUAD 0.5 ML IM SUSP
0.5000 mL | INTRAMUSCULAR | Status: AC
  Administered 2012-11-27: 0.5 mL via INTRAMUSCULAR
  Filled 2012-11-26 (×2): qty 0.5

## 2012-11-26 MED ORDER — SODIUM CHLORIDE 0.9 % IR SOLN
Status: DC | PRN
  Administered 2012-11-26: 1

## 2012-11-26 MED ORDER — SODIUM CHLORIDE 0.9 % IJ SOLN: 3.0000 mL | INTRAMUSCULAR | Status: DC | PRN

## 2012-11-26 MED ORDER — PROMETHAZINE HCL 25 MG/ML IJ SOLN: 6.2500 mg | INTRAMUSCULAR | Status: DC | PRN

## 2012-11-26 MED ORDER — ACETAMINOPHEN 325 MG PO TABS: 650.0000 mg | ORAL_TABLET | ORAL | Status: DC | PRN

## 2012-11-26 MED ORDER — SODIUM CHLORIDE 0.9 % IV SOLN
INTRAVENOUS | Status: DC | PRN
  Administered 2012-11-26: 12:00:00

## 2012-11-26 MED ORDER — NEOSTIGMINE METHYLSULFATE 1 MG/ML IJ SOLN
INTRAMUSCULAR | Status: DC | PRN
  Administered 2012-11-26: 4 mg via INTRAVENOUS

## 2012-11-26 MED ORDER — HYDROMORPHONE HCL PF 1 MG/ML IJ SOLN
0.2500 mg | INTRAMUSCULAR | Status: DC | PRN
  Administered 2012-11-26 (×3): 0.5 mg via INTRAVENOUS

## 2012-11-26 MED ORDER — SODIUM CHLORIDE 0.9 % IV SOLN: 250.0000 mL | INTRAVENOUS | Status: DC | PRN

## 2012-11-26 MED ORDER — ROCURONIUM BROMIDE 100 MG/10ML IV SOLN
INTRAVENOUS | Status: DC | PRN
  Administered 2012-11-26: 50 mg via INTRAVENOUS

## 2012-11-26 MED ORDER — SODIUM CHLORIDE 0.9 % IV SOLN
INTRAVENOUS | Status: DC
  Administered 2012-11-26: 16:00:00 via INTRAVENOUS

## 2012-11-26 MED ORDER — MIDAZOLAM HCL 2 MG/2ML IJ SOLN: 0.5000 mg | Freq: Once | INTRAMUSCULAR | Status: DC | PRN

## 2012-11-26 MED ORDER — PROPOFOL 10 MG/ML IV BOLUS
INTRAVENOUS | Status: DC | PRN
  Administered 2012-11-26: 100 mg via INTRAVENOUS

## 2012-11-26 MED ORDER — ONDANSETRON HCL 4 MG/2ML IJ SOLN
INTRAMUSCULAR | Status: DC | PRN
  Administered 2012-11-26: 4 mg via INTRAVENOUS

## 2012-11-26 MED ORDER — BUPIVACAINE-EPINEPHRINE 0.25% -1:200000 IJ SOLN
INTRAMUSCULAR | Status: DC | PRN
  Administered 2012-11-26: 30 mL

## 2012-11-26 MED ORDER — SODIUM CHLORIDE 0.9 % IJ SOLN: 3.0000 mL | Freq: Two times a day (BID) | INTRAMUSCULAR | Status: DC

## 2012-11-26 MED ORDER — MEPERIDINE HCL 25 MG/ML IJ SOLN: 6.2500 mg | INTRAMUSCULAR | Status: DC | PRN

## 2012-11-26 MED ORDER — ACETAMINOPHEN 650 MG RE SUPP: 650.0000 mg | RECTAL | Status: DC | PRN

## 2012-11-26 SURGICAL SUPPLY — 50 items
ADH SKN CLS APL DERMABOND .7 (GAUZE/BANDAGES/DRESSINGS) ×1
APL SKNCLS STERI-STRIP NONHPOA (GAUZE/BANDAGES/DRESSINGS)
APPLIER CLIP 5 13 M/L LIGAMAX5 (MISCELLANEOUS) ×2
APR CLP MED LRG 5 ANG JAW (MISCELLANEOUS) ×1
BAG SPEC RTRVL LRG 6X4 10 (ENDOMECHANICALS) ×1
BANDAGE ADHESIVE 1X3 (GAUZE/BANDAGES/DRESSINGS) ×3 IMPLANT
BENZOIN TINCTURE PRP APPL 2/3 (GAUZE/BANDAGES/DRESSINGS) ×1 IMPLANT
BLADE SURG ROTATE 9660 (MISCELLANEOUS) IMPLANT
CANISTER SUCTION 2500CC (MISCELLANEOUS) ×2 IMPLANT
CHLORAPREP W/TINT 26ML (MISCELLANEOUS) ×2 IMPLANT
CLIP APPLIE 5 13 M/L LIGAMAX5 (MISCELLANEOUS) ×1 IMPLANT
CLOTH BEACON ORANGE TIMEOUT ST (SAFETY) ×1 IMPLANT
COVER MAYO STAND STRL (DRAPES) ×2 IMPLANT
COVER SURGICAL LIGHT HANDLE (MISCELLANEOUS) ×2 IMPLANT
DECANTER SPIKE VIAL GLASS SM (MISCELLANEOUS) ×2 IMPLANT
DERMABOND ADVANCED (GAUZE/BANDAGES/DRESSINGS) ×1
DERMABOND ADVANCED .7 DNX12 (GAUZE/BANDAGES/DRESSINGS) IMPLANT
DRAPE C-ARM 42X72 X-RAY (DRAPES) ×2 IMPLANT
DRAPE UTILITY 15X26 W/TAPE STR (DRAPE) ×4 IMPLANT
DRSG TEGADERM 4X4.75 (GAUZE/BANDAGES/DRESSINGS) ×1 IMPLANT
ELECT REM PT RETURN 9FT ADLT (ELECTROSURGICAL) ×2
ELECTRODE REM PT RTRN 9FT ADLT (ELECTROSURGICAL) ×1 IMPLANT
GAUZE SPONGE 2X2 8PLY STRL LF (GAUZE/BANDAGES/DRESSINGS) ×1 IMPLANT
GLOVE BIO SURGEON STRL SZ8 (GLOVE) ×1 IMPLANT
GLOVE BIOGEL M STRL SZ7.5 (GLOVE) ×2 IMPLANT
GLOVE BIOGEL PI IND STRL 7.0 (GLOVE) IMPLANT
GLOVE BIOGEL PI IND STRL 8 (GLOVE) ×1 IMPLANT
GLOVE BIOGEL PI INDICATOR 7.0 (GLOVE) ×1
GLOVE BIOGEL PI INDICATOR 8 (GLOVE) ×1
GLOVE SURG SS PI 7.0 STRL IVOR (GLOVE) ×1 IMPLANT
GOWN STRL NON-REIN LRG LVL3 (GOWN DISPOSABLE) ×6 IMPLANT
GOWN STRL REIN XL XLG (GOWN DISPOSABLE) ×2 IMPLANT
KIT BASIN OR (CUSTOM PROCEDURE TRAY) ×2 IMPLANT
KIT ROOM TURNOVER OR (KITS) ×2 IMPLANT
NS IRRIG 1000ML POUR BTL (IV SOLUTION) ×2 IMPLANT
PAD ARMBOARD 7.5X6 YLW CONV (MISCELLANEOUS) ×3 IMPLANT
POUCH SPECIMEN RETRIEVAL 10MM (ENDOMECHANICALS) ×2 IMPLANT
SCISSORS LAP 5X35 DISP (ENDOMECHANICALS) IMPLANT
SET CHOLANGIOGRAPH 5 50 .035 (SET/KITS/TRAYS/PACK) ×2 IMPLANT
SET IRRIG TUBING LAPAROSCOPIC (IRRIGATION / IRRIGATOR) ×2 IMPLANT
SLEEVE ENDOPATH XCEL 5M (ENDOMECHANICALS) ×4 IMPLANT
SPECIMEN JAR SMALL (MISCELLANEOUS) ×2 IMPLANT
SPONGE GAUZE 2X2 STER 10/PKG (GAUZE/BANDAGES/DRESSINGS) ×1
SUT MNCRL AB 4-0 PS2 18 (SUTURE) ×2 IMPLANT
SUT VICRYL 0 UR6 27IN ABS (SUTURE) ×1 IMPLANT
TOWEL OR 17X24 6PK STRL BLUE (TOWEL DISPOSABLE) ×2 IMPLANT
TOWEL OR 17X26 10 PK STRL BLUE (TOWEL DISPOSABLE) ×2 IMPLANT
TRAY LAPAROSCOPIC (CUSTOM PROCEDURE TRAY) ×2 IMPLANT
TROCAR XCEL BLUNT TIP 100MML (ENDOMECHANICALS) ×2 IMPLANT
TROCAR XCEL NON-BLD 5MMX100MML (ENDOMECHANICALS) ×2 IMPLANT

## 2012-11-26 NOTE — H&P (Signed)
Sheryl Turner is an 57 y.o. female.   Chief Complaint: here for surgery HPI: 57 yo WF presents for elective surgery due to epigastric pain and gallbladder disease.   In review: 57 yo WF referred by [name] for evaluation for cholecystectomy. The patient states at the end of June she developed severe constant epigastric pain. The pain radiated to her back. She states that it felt like a knife was in her upper abdomen. She went to the emergency department because the pain was so severe. Her pain was relieved after medications and she was discharged home. After returning home her pain returned and she subsequently presented back to the emergency department. She was found to have an AST of 839, ALT of 716, and total bilirubin of 2.1. Her lipase was mildly elevated.Her cardiac workup was negative. She underwent abdominal ultrasound which demonstrated extrahepatic biliary dilatation. There is no evidence of cholecystitis or cholelithiasis on her abdominal ultrasound. She was admitted for pain control as well as treatment. GI was consulted.An MRCP was done which showed mild to moderate dilatation of the common bile duct with some tapering distally of the common bile duct. It was unclear whether or not it represented a stricture or ampullary tumor. There is also some evidence of pancreas divisum as well as an 8 x 5 mm cyst in the uncinate process.  This prompted an endoscopic ultrasound which revealed an abrupt cutoff of the bile duct at the level of the ampulla. There is also a 1 cm periampullary diverticulum. There is no evidence of ampullary or pancreatic mass seen. There was large amounts of indwelling sludge in the bile duct. There is benign-appearing portal lymph nodes. The following day she underwent an ERCP with sphincterotomy and stenting. Her abdominal pain improved and she tolerated a diet and she was discharged home   She has followed up with GI and had repeat ERCP with stent extraction with brushings  in early august. No obvious stricture.  Brushings showed no malignancy but mild atypia which was indeterminate. Her repeat LFTs and repeat Ca 19-9 have normalized. She states that she continues to have some mild epigastric pain. It is not nearly as bad as it was when she was hospitalized.She reports occasional nausea. She has had some constipation. She denies any recent NSAID use. She denies any significant weight loss. She denies family history of cancer   Past Medical History  Diagnosis Date  . Pancreatitis     09-14-12 recent Hospital stay [facility]  . Thyroid disease   . Hypothyroidism   . GERD (gastroesophageal reflux disease)   . H/O hiatal hernia   . Hyperthyroidism   . Jaundice 09/2012    noticed in eyes, /w obstruction     Past Surgical History  Procedure Laterality Date  . Cesarean section    . Appendectomy    . Abdominal hysterectomy    . Tubal ligation    . Eus Left 09/15/2012    Procedure: UPPER ENDOSCOPIC ULTRASOUND (EUS) RADIAL, possible LINEAR to follow, possible side-viewing endoscope;  Surgeon: Willis Modena, MD;  Location: [facility] ENDOSCOPY;  Service: Endoscopy;  Laterality: Left;  . Ercp N/A 09/16/2012    Procedure: ENDOSCOPIC RETROGRADE CHOLANGIOPANCREATOGRAPHY (ERCP);  Surgeon: Petra Kuba, MD;  Location: [facility] OR;  Service: Endoscopy;  Laterality: N/A;  . Ercp N/A 10/20/2012    Procedure: ENDOSCOPIC RETROGRADE CHOLANGIOPANCREATOGRAPHY (ERCP);  Surgeon: Petra Kuba, MD;  Location: [facility];  Service: Endoscopy;  Laterality: N/A;  . Spyglass cholangioscopy N/A 10/20/2012  Procedure: SPYGLASS CHOLANGIOSCOPY;  Surgeon: Petra Kuba, MD;  Location: [facility]OSCOPY;  Service: Endoscopy;  Laterality: N/A;    History reviewed. No pertinent family history. Social History:  reports that she quit smoking about 14 years ago. Her smoking use included Cigarettes. She has a 22 pack-year smoking history. She does not have any smokeless tobacco history on file. She reports that she does not  drink alcohol or use illicit drugs.  Allergies:  Allergies  Allergen Reactions  . Darvocet [Propoxyphene-Acetaminophen] Nausea And Vomiting  . Penicillins Other (See Comments)    Oral forms -causes mouth ulcers  . Adhesive [Tape]     Skin irritation     Medications Prior to Admission  Medication Sig Dispense Refill  . docusate sodium (COLACE) 100 MG capsule Take 100 mg by mouth at bedtime.       Marland Kitchen levothyroxine (SYNTHROID, LEVOTHROID) 100 MCG tablet Take 100 mcg by mouth daily before breakfast.      . Magnesium Hydroxide (MILK OF MAGNESIA PO) Take 1 capsule by mouth daily.      . pantoprazole (PROTONIX) 40 MG tablet Take 40 mg by mouth daily before breakfast.        No results found for this or any previous visit (from the past 48 hour(s)). No results found.  Review of Systems  Constitutional: Negative for fever, chills and weight loss.  Respiratory: Negative for sputum production and shortness of breath.   Cardiovascular: Negative for chest pain, palpitations, orthopnea and PND.  Gastrointestinal: Positive for heartburn, nausea and abdominal pain. Negative for vomiting, diarrhea, constipation and melena.  Genitourinary: Negative for dysuria and urgency.  Neurological: Negative for dizziness, tingling, seizures, loss of consciousness and weakness.    Blood pressure 136/84, pulse 69, temperature 97.6 F (36.4 C), temperature source Oral, resp. rate 18, SpO2 98.00%. Physical Exam  Vitals reviewed. Constitutional: She is oriented to person, place, and time. She appears well-nourished. No distress.  obese  HENT:  Head: Normocephalic and atraumatic.  Right Ear: External ear normal.  Left Ear: External ear normal.  Eyes: Conjunctivae are normal. No scleral icterus.  Neck: No tracheal deviation present.  Cardiovascular: Normal rate.   Respiratory: Effort normal. No stridor. No respiratory distress.  GI: Soft. She exhibits no distension. There is no tenderness.  Neurological:  She is alert and oriented to person, place, and time.  Skin: Skin is warm and dry. No rash noted. She is not diaphoretic. No erythema.  Psychiatric: She has a normal mood and affect. Her behavior is normal. Thought content normal.     Assessment/Plan Epigastric pain Gallbladder/biliary sludge H/o abnormal radiological appearance of biliary tract Pancreas divisum  To for Lap chole with ioc All questions asked and answered Discussed typical postop course.  Pt agrees to proceed with surgery  Mary Sella. Andrey Campanile, MD, FACS General, Bariatric, & Minimally Invasive Surgery [facility], [place]   [facility] M 11/26/2012, 11:10 AM

## 2012-11-26 NOTE — Anesthesia Postprocedure Evaluation (Signed)
  Anesthesia Post-op Note  Patient: Sheryl Turner  Procedure(s) Performed: Procedure(s): LAPAROSCOPIC CHOLECYSTECTOMY WITH INTRAOPERATIVE CHOLANGIOGRAM (N/A)  Patient Location: PACU  Anesthesia Type:General  Level of Consciousness: awake, alert , oriented and patient cooperative  Airway and Oxygen Therapy: Patient Spontanous Breathing and Patient connected to nasal cannula oxygen  Post-op Pain: mild  Post-op Assessment: Post-op Vital signs reviewed, Patient's Cardiovascular Status Stable, Respiratory Function Stable, Patent Airway, No signs of Nausea or vomiting and Pain level controlled  Post-op Vital Signs: Reviewed and stable  Complications: No apparent anesthesia complications

## 2012-11-26 NOTE — Anesthesia Procedure Notes (Signed)
Procedure Name: Intubation Date/Time: 11/26/2012 12:05 PM Performed by: Elon Alas Pre-anesthesia Checklist: Patient identified, Timeout performed, Emergency Drugs available, Suction available and Patient being monitored Patient Re-evaluated:Patient Re-evaluated prior to inductionOxygen Delivery Method: Circle system utilized Preoxygenation: Pre-oxygenation with 100% oxygen Intubation Type: IV induction Ventilation: Mask ventilation without difficulty Laryngoscope Size: Mac and 3 Grade View: Grade I Tube type: Oral Tube size: 7.0 mm Number of attempts: 1 Airway Equipment and Method: Stylet Placement Confirmation: positive ETCO2,  ETT inserted through vocal cords under direct vision and breath sounds checked- equal and bilateral Secured at: 21 cm Tube secured with: Tape Dental Injury: Teeth and Oropharynx as per pre-operative assessment

## 2012-11-26 NOTE — Preoperative (Signed)
Beta Blockers   Reason not to administer Beta Blockers:Not Applicable 

## 2012-11-26 NOTE — Op Note (Signed)
Laparoscopic Cholecystectomy with IOC Procedure Note  Indications: This patient presents with symptomatic gallbladder disease and history of bile duct sludge causing elevated LFTS which required ERCP and sphincterotomy and stent placement which has been subsequently removed and will undergo laparoscopic cholecystectomy. Please see my h&p  Pre-operative Diagnosis: Chronic cholecystitis  Post-operative Diagnosis: Same  Surgeon: Atilano Ina   Assistants: none  Anesthesia: General endotracheal anesthesia  ASA Class: 2  Procedure Details  The patient was seen again in the Holding Room. The risks, benefits, complications, treatment options, and expected outcomes were discussed with the patient. The possibilities of reaction to medication, pulmonary aspiration, perforation of viscus, bleeding, recurrent infection, finding a normal gallbladder, the need for additional procedures, failure to diagnose a condition, the possible need to convert to an open procedure, and creating a complication requiring transfusion or operation were discussed with the patient. The likelihood of improving the patient's symptoms with return to their baseline status is good.  The patient and/or family concurred with the proposed plan, giving informed consent. The site of surgery properly noted. The patient was taken to Operating Room, identified as Sheryl Turner and the procedure verified as Laparoscopic Cholecystectomy with Intraoperative Cholangiogram. A Time Out was held and the above information confirmed.  Prior to the induction of general anesthesia, antibiotic prophylaxis was administered. General endotracheal anesthesia was then administered and tolerated well. After the induction, the abdomen was prepped with Chloraprep and draped in the sterile fashion. The patient was positioned in the supine position.  Local anesthetic agent was injected into the skin near the umbilicus and an incision made through an old  transverse infraumbilical incision. We dissected down to the abdominal fascia with blunt dissection.  The fascia was incised vertically and we entered the peritoneal cavity bluntly.  A pursestring suture of 0-Vicryl was placed around the fascial opening.  The Hasson cannula was inserted and secured with the stay suture.  Pneumoperitoneum was then created with CO2 and tolerated well without any adverse changes in the patient's vital signs. An 5-mm port was placed in the subxiphoid position.  Two 5-mm ports were placed in the right upper quadrant. All skin incisions were infiltrated with a local anesthetic agent before making the incision and placing the trocars.   We positioned the patient in reverse Trendelenburg, tilted slightly to the patient's left.  The gallbladder was identified, the fundus grasped and retracted cephalad. Adhesions were lysed bluntly and with the electrocautery where indicated, taking care not to injure any adjacent organs or viscus. The infundibulum was grasped and retracted laterally, exposing the peritoneum overlying the triangle of Calot. This was then divided and exposed in a blunt fashion. A critical view of the cystic duct and cystic artery was obtained.  The cystic duct was clearly identified and bluntly dissected circumferentially. The cystic duct was ligated with a clip distally.   An incision was made in the cystic duct and the [facility] cholangiogram catheter introduced. The catheter was secured using a clip. A cholangiogram was then obtained which showed good visualization of the distal and proximal biliary tree with no sign of filling defects or obstruction.  Contrast flowed easily into the duodenum. The catheter was then removed.   The cystic duct was then ligated with 3 clips and divided. The cystic artery (both an anterior and posterior branch) was identified, dissected free, ligated with clips and divided as well.   The gallbladder was dissected from the liver bed in  retrograde fashion with the electrocautery. The gallbladder was  removed and placed in an Endocatch sac.  The gallbladder and Endocatch sac were then removed through the umbilical port site. The liver bed was irrigated and inspected. Hemostasis was achieved with the electrocautery. Copious irrigation was utilized and was repeatedly aspirated until clear.  The pursestring suture was used to close the umbilical fascia.  There was a little give to the right of fascial defect so I placed 2 additional interrupted 0-vicryl sutures.   We again inspected the right upper quadrant for hemostasis.  The umbilical closure was inspected and there was no air leak and nothing trapped within the closure. Pneumoperitoneum was released as we removed the trocars.  4-0 Monocryl was used to close the skin.   Dermabond was applied. The patient was then extubated and brought to the recovery room in stable condition. Instrument, sponge, and needle counts were correct at closure and at the conclusion of the case.   Findings: Chronic Cholecystitis  Estimated Blood Loss: Minimal         Drains: none         Specimens: Gallbladder           Complications: None; patient tolerated the procedure well.         Disposition: PACU - hemodynamically stable.         Condition: stable  Sheryl Turner. Andrey Campanile, MD, FACS General, Bariatric, & Minimally Invasive Surgery [facility], [place]

## 2012-11-26 NOTE — Anesthesia Preprocedure Evaluation (Addendum)
Anesthesia Evaluation  Patient identified by MRN, date of birth, ID band Patient awake    Reviewed: Allergy & Precautions, H&P , NPO status , Patient's Chart, lab work & pertinent test results  History of Anesthesia Complications Negative for: history of anesthetic complications  Airway Mallampati: II TM Distance: >3 FB Neck ROM: Full    Dental  (+) Dental Advisory Given, Poor Dentition and Missing   Pulmonary former smoker (quit 2000),  breath sounds clear to auscultation  Pulmonary exam normal       Cardiovascular - anginanegative cardio ROS  Rhythm:Regular Rate:Normal     Neuro/Psych negative neurological ROS     GI/Hepatic hiatal hernia, GERD-  Medicated and Controlled,Elevated LFTs with cholecystitis Recent pancreatitis   Endo/Other  Hypothyroidism Morbid obesity  Renal/GU negative Renal ROS     Musculoskeletal   Abdominal (+) + obese,   Peds  Hematology   Anesthesia Other Findings   Reproductive/Obstetrics                          Anesthesia Physical Anesthesia Plan  ASA: II  Anesthesia Plan: General   Post-op Pain Management:    Induction: Intravenous  Airway Management Planned: Oral ETT  Additional Equipment:   Intra-op Plan:   Post-operative Plan: Extubation in OR  Informed Consent: I have reviewed the patients History and Physical, chart, labs and discussed the procedure including the risks, benefits and alternatives for the proposed anesthesia with the patient or authorized representative who has indicated his/her understanding and acceptance.   Dental advisory given  Plan Discussed with: CRNA and Surgeon  Anesthesia Plan Comments: (Plan routine monitors, GETA)        Anesthesia Quick Evaluation

## 2012-11-26 NOTE — Transfer of Care (Signed)
Immediate Anesthesia Transfer of Care Note  Patient: Sheryl Turner  Procedure(s) Performed: Procedure(s): LAPAROSCOPIC CHOLECYSTECTOMY WITH INTRAOPERATIVE CHOLANGIOGRAM (N/A)  Patient Location: PACU  Anesthesia Type:General  Level of Consciousness: awake, alert  and oriented  Airway & Oxygen Therapy: Patient Spontanous Breathing and Patient connected to nasal cannula oxygen  Post-op Assessment: Report given to PACU RN, Post -op Vital signs reviewed and stable and Patient moving all extremities X 4  Post vital signs: Reviewed and stable  Complications: No apparent anesthesia complications

## 2012-11-27 NOTE — Progress Notes (Signed)
Discharge home. Home discharge instructions given , no question verbalized. 

## 2012-11-27 NOTE — Discharge Summary (Signed)
Patient ID: Sheryl Turner [id number] 57 y.o. November 20, 1955  Admit date: 11/26/2012  Discharge date and time: 11/27/2012  Admitting Physician: Ernestene Mention  Discharge Physician: Ernestene Mention  Admission Diagnoses: gallstones  Discharge Diagnoses: Gallstones  Operations: Procedure(s): LAPAROSCOPIC CHOLECYSTECTOMY WITH INTRAOPERATIVE CHOLANGIOGRAM  Admission Condition: good  Discharged Condition: good  Indication for Admission: 57 yo WF referred by Dr Sheryl Turner for evaluation for cholecystectomy. The patient states at the end of June she developed severe constant epigastric pain. The pain radiated to her back. She states that it felt like a knife was in her upper abdomen. She went to the emergency department because the pain was so severe. Her pain was relieved after medications and she was discharged home. After returning home her pain returned and she subsequently presented back to the emergency department. She was found to have an AST of 839, ALT of 716, and total bilirubin of 2.1. Her lipase was mildly elevated.Her cardiac workup was negative. She underwent abdominal ultrasound which demonstrated extrahepatic biliary dilatation. 57 yo WF referred of cholecystitis or cholelithiasis on her abdominal ultrasound. She was admitted for pain control as well as treatment. GI was consulted.An MRCP was done which showed mild to moderate dilatation of the common bile duct with some tapering distally of the common bile duct. It was unclear whether or not it represented a stricture or ampullary tumor. There is also some evidence of pancreas divisum as well as an 8 x 5 mm cyst in the uncinate process.  This prompted an endoscopic ultrasound which revealed an abrupt cutoff of the bile duct at the level of the ampulla. There is also a 1 cm periampullary diverticulum. 57 yo WF referred of ampullary or pancreatic mass seen. There was large amounts of indwelling sludge in the bile duct. There is  benign-appearing portal lymph nodes. The following day she underwent an ERCP with sphincterotomy and stenting. Her abdominal pain improved and she tolerated a diet and she was discharged home  She has followed up with GI and had repeat ERCP with stent extraction with brushings in early august. No obvious stricture. Brushings showed no malignancy but mild atypia which was indeterminate. Her repeat LFTs and repeat Ca 19-9 have normalized. She states that she continues to have some mild epigastric pain. It is not nearly as bad as it was when she was hospitalized.She reports occasional nausea. She has had some constipation. She denies any recent NSAID use. She denies any significant weight loss. She denies family history of cancer She is brought to the hospital for elective cholecystectomy.  Hospital Course: On the date of admission The patient was taken to the operating room and underwent a laparoscopic cholecystectomy with cholangiogram. The cholangiogram was normal, showing a patent biliary system and no evidence for filling defects or stones. She originally was going to go home the same day but elected to stay overnight. She did well overnight and wanted to go home the following morning. We'll discharge her abdomen was soft and nontender in the wounds looked good. She was advised diet and activities. She has an appointment to see Dr. Andrey Turner on October 2. She was given a prescription for oxycodone.  Consults: None  Significant Diagnostic Studies: Surgical pathology, pending. Intraoperative cholangiogram.  Treatments: surgery: Laparoscopic cholecystectomy with cholangiogram  Disposition: Home  Patient Instructions:    Medication List         docusate sodium 100 MG capsule  Commonly known as:  COLACE  Take 100 mg by mouth at bedtime.  levothyroxine 100 MCG tablet  Commonly known as:  SYNTHROID, LEVOTHROID  Take 100 mcg by mouth daily before breakfast.     MILK OF MAGNESIA PO  Take 1  capsule by mouth daily.     oxyCODONE 5 MG immediate release tablet  Commonly known as:  ROXICODONE  Take 1-2 tablets (5-10 mg total) by mouth every 4 (four) hours as needed for pain.     pantoprazole 40 MG tablet  Commonly known as:  PROTONIX  Take 40 mg by mouth daily before breakfast.        Activity: activity as tolerated Diet: low fat, low cholesterol diet Wound Care: none needed  Follow-up:  With Dr. Andrey Turner in 2 weeks.  Signed: Angelia Turner. Sheryl Turner, M.D., FACS General and minimally invasive surgery Breast and Colorectal Surgery  11/27/2012, 8:33 AM

## 2012-11-30 ENCOUNTER — Encounter (HOSPITAL_COMMUNITY): Payer: Self-pay | Admitting: General Surgery

## 2012-12-14 ENCOUNTER — Encounter (INDEPENDENT_AMBULATORY_CARE_PROVIDER_SITE_OTHER): Payer: Self-pay

## 2012-12-16 ENCOUNTER — Encounter (INDEPENDENT_AMBULATORY_CARE_PROVIDER_SITE_OTHER): Payer: Self-pay | Admitting: General Surgery

## 2012-12-16 ENCOUNTER — Ambulatory Visit (INDEPENDENT_AMBULATORY_CARE_PROVIDER_SITE_OTHER): Payer: Self-pay | Admitting: General Surgery

## 2012-12-16 VITALS — BP 118/64 | HR 68 | Resp 16 | Ht 64.0 in | Wt 200.6 lb

## 2012-12-16 DIAGNOSIS — E079 Disorder of thyroid, unspecified: Secondary | ICD-10-CM

## 2012-12-16 DIAGNOSIS — Z09 Encounter for follow-up examination after completed treatment for conditions other than malignant neoplasm: Secondary | ICD-10-CM

## 2012-12-16 MED ORDER — LEVOTHYROXINE SODIUM 100 MCG PO TABS: 100.0000 ug | ORAL_TABLET | Freq: Every day | ORAL | Status: DC

## 2012-12-16 NOTE — Patient Instructions (Signed)
Call with any questions

## 2012-12-17 NOTE — Progress Notes (Signed)
Subjective:     Patient ID: Sheryl Turner, female   DOB: 03-15-1956, 57 y.o.   MRN: [id number]  HPI 57 year old Caucasian female comes in for followup after undergoing laparoscopic cholecystectomy on September 12. She was kept in the hospital overnight for pain control. Since surgeries she states that she has been doing well. Unfortunately she has had 2 family members passed away in the interim. Preoperatively she had an unusual course with respect to needing ERCP, sphincterotomy and stent placement. Her intraoperative cholangiogram was normal. There is no evidence of filling defects. She denies any fever, chills, nausea, vomiting, diarrhea or constipation. She still has some soreness around her umbilical area. She reports a good appetite.  Review of Systems     Objective:   Physical Exam BP 118/64  Pulse 68  Resp 16  Ht 5\' 4"  (1.626 m)  Wt 200 lb 9.6 oz (90.992 kg)  BMI 34.42 kg/m2  Gen: alert, NAD, non-toxic appearing Pupils: equal, no scleral icterus Pulm: Lungs clear to auscultation, symmetric chest rise CV: regular rate and rhythm Abd: soft, nontender, nondistended. Well-healed trocar sites. No cellulitis. No incisional hernia Ext: no edema,  Skin: no rash, no jaundice     Assessment:     Status post laparoscopic cholecystectomy with cholangiogram     Plan:     We reviewed the surgical pathology which showed chronic cholecystitis. She was given a copy of her path report. I explained that the soreness around her umbilicus will continue to improve and should resolve. I have released her to full activities. All of her questions were asked and answered. Followup as needed  Mary Sella. Andrey Campanile, MD, FACS General, Bariatric, & Minimally Invasive Surgery [facility] Surgery, [place]

## 2015-01-26 ENCOUNTER — Encounter (HOSPITAL_COMMUNITY): Payer: Self-pay | Admitting: Emergency Medicine

## 2015-01-26 ENCOUNTER — Emergency Department (HOSPITAL_COMMUNITY)
Admission: EM | Admit: 2015-01-26 | Discharge: 2015-01-26 | Disposition: A | Discharge status: Home / Self Care | Payer: 59 | Attending: Emergency Medicine | Admitting: Emergency Medicine

## 2015-01-26 ENCOUNTER — Emergency Department (HOSPITAL_COMMUNITY): Payer: 59

## 2015-01-26 DIAGNOSIS — E0590 Thyrotoxicosis, unspecified without thyrotoxic crisis or storm: Secondary | ICD-10-CM | POA: Insufficient documentation

## 2015-01-26 DIAGNOSIS — Z880 Allergy status to penicillin: Secondary | ICD-10-CM | POA: Insufficient documentation

## 2015-01-26 DIAGNOSIS — S20219A Contusion of unspecified front wall of thorax, initial encounter: Secondary | ICD-10-CM

## 2015-01-26 DIAGNOSIS — Z79899 Other long term (current) drug therapy: Secondary | ICD-10-CM | POA: Diagnosis not present

## 2015-01-26 DIAGNOSIS — Y9389 Activity, other specified: Secondary | ICD-10-CM | POA: Diagnosis not present

## 2015-01-26 DIAGNOSIS — Z87891 Personal history of nicotine dependence: Secondary | ICD-10-CM | POA: Insufficient documentation

## 2015-01-26 DIAGNOSIS — S301XXA Contusion of abdominal wall, initial encounter: Secondary | ICD-10-CM | POA: Insufficient documentation

## 2015-01-26 DIAGNOSIS — S29091A Other injury of muscle and tendon of front wall of thorax, initial encounter: Secondary | ICD-10-CM | POA: Diagnosis present

## 2015-01-26 DIAGNOSIS — Y998 Other external cause status: Secondary | ICD-10-CM | POA: Diagnosis not present

## 2015-01-26 DIAGNOSIS — K219 Gastro-esophageal reflux disease without esophagitis: Secondary | ICD-10-CM | POA: Diagnosis not present

## 2015-01-26 DIAGNOSIS — E039 Hypothyroidism, unspecified: Secondary | ICD-10-CM | POA: Diagnosis not present

## 2015-01-26 DIAGNOSIS — Y92410 Unspecified street and highway as the place of occurrence of the external cause: Secondary | ICD-10-CM | POA: Diagnosis not present
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
## 2015-01-26 NOTE — Discharge Instructions (Signed)
Chest Contusion A chest contusion is a deep bruise on your chest area. Contusions are the result of an injury that caused bleeding under the skin. A chest contusion may involve bruising of the skin, muscles, or ribs. The contusion may turn blue, purple, or yellow. Minor injuries will give you a painless contusion, but more severe contusions may stay painful and swollen for a few weeks. CAUSES  A contusion is usually caused by a blow, trauma, or direct force to an area of the body. SYMPTOMS   Swelling and redness of the injured area.  Discoloration of the injured area.  Tenderness and soreness of the injured area.  Pain. DIAGNOSIS  The diagnosis can be made by taking a history and performing a physical exam. An X-ray, CT scan, or MRI may be needed to determine if there were any associated injuries, such as broken bones (fractures) or internal injuries. TREATMENT  Often, the best treatment for a chest contusion is resting, icing, and applying cold compresses to the injured area. Deep breathing exercises may be recommended to reduce the risk of pneumonia. Over-the-counter medicines may also be recommended for pain control. HOME CARE INSTRUCTIONS   Put ice on the injured area.  Put ice in a plastic bag.  Place a towel between your skin and the bag.  Leave the ice on for 15-20 minutes, 03-04 times a day.  Only take over-the-counter or prescription medicines as directed by your caregiver. Your caregiver may recommend avoiding anti-inflammatory medicines (aspirin, ibuprofen, and naproxen) for 48 hours because these medicines may increase bruising.  Rest the injured area.  Perform deep-breathing exercises as directed by your caregiver.  Stop smoking if you smoke.  Do not lift objects over 5 pounds (2.3 kg) for 3 days or longer if recommended by your caregiver. SEEK IMMEDIATE MEDICAL CARE IF:   You have increased bruising or swelling.  You have pain that is getting worse.  You have  difficulty breathing.  You have dizziness, weakness, or fainting.  You have blood in your urine or stool.  You cough up or vomit blood.  Your swelling or pain is not relieved with medicines. MAKE SURE YOU:   Understand these instructions.  Will watch your condition.  Will get help right away if you are not doing well or get worse.   This information is not intended to replace advice given to you by your health care provider. Make sure you discuss any questions you have with your health care provider.   Document Released: 11/26/2000 Document Revised: 11/26/2011 Document Reviewed: 08/25/2011 Elsevier Interactive Patient Education 2016 [employer]  Tourist information centre managerMotor Vehicle Collision It is common to have multiple bruises and sore muscles after a motor vehicle collision (MVC). These tend to feel worse for the first 24 hours. You may have the most stiffness and soreness over the first several hours. You may also feel worse when you wake up the first morning after your collision. After this point, you will usually begin to improve with each day. The speed of improvement often depends on the severity of the collision, the number of injuries, and the location and nature of these injuries. HOME CARE INSTRUCTIONS  Put ice on the injured area.  Put ice in a plastic bag.  Place a towel between your skin and the bag.  Leave the ice on for 15-20 minutes, 3-4 times a day, or as directed by your health care provider.  Drink enough fluids to keep your urine clear or pale yellow. Do not  drink alcohol.  Take a warm shower or bath once or twice a day. This will increase blood flow to sore muscles.  You may return to activities as directed by your caregiver. Be careful when lifting, as this may aggravate neck or back pain.  Only take over-the-counter or prescription medicines for pain, discomfort, or fever as directed by your caregiver. Do not use aspirin. This may increase bruising and bleeding. SEEK  IMMEDIATE MEDICAL CARE IF:  You have numbness, tingling, or weakness in the arms or legs.  You develop severe headaches not relieved with medicine.  You have severe neck pain, especially tenderness in the middle of the back of your neck.  You have changes in bowel or bladder control.  There is increasing pain in any area of the body.  You have shortness of breath, light-headedness, dizziness, or fainting.  You have chest pain.  You feel sick to your stomach (nauseous), throw up (vomit), or sweat.  You have increasing abdominal discomfort.  There is blood in your urine, stool, or vomit.  You have pain in your shoulder (shoulder strap areas).  You feel your symptoms are getting worse. MAKE SURE YOU:  Understand these instructions.  Will watch your condition.  Will get help right away if you are not doing well or get worse.   This information is not intended to replace advice given to you by your health care provider. Make sure you discuss any questions you have with your health care provider.   Document Released: 03/03/2005 Document Revised: 03/24/2014 Document Reviewed: 07/31/2010 Elsevier Interactive Patient Education [employer]

## 2017-03-10 ENCOUNTER — Emergency Department (HOSPITAL_COMMUNITY)
Admission: EM | Admit: 2017-03-10 | Discharge: 2017-03-10 | Disposition: A | Discharge status: Home / Self Care | Payer: BLUE CROSS/BLUE SHIELD | Attending: Emergency Medicine | Admitting: Emergency Medicine

## 2017-03-10 ENCOUNTER — Emergency Department (HOSPITAL_COMMUNITY): Payer: BLUE CROSS/BLUE SHIELD

## 2017-03-10 ENCOUNTER — Encounter (HOSPITAL_COMMUNITY): Payer: Self-pay

## 2017-03-10 ENCOUNTER — Other Ambulatory Visit: Payer: Self-pay

## 2017-03-10 DIAGNOSIS — R200 Anesthesia of skin: Secondary | ICD-10-CM | POA: Insufficient documentation

## 2017-03-10 DIAGNOSIS — R0602 Shortness of breath: Secondary | ICD-10-CM | POA: Diagnosis not present

## 2017-03-10 DIAGNOSIS — Z79899 Other long term (current) drug therapy: Secondary | ICD-10-CM | POA: Diagnosis not present

## 2017-03-10 DIAGNOSIS — Z87891 Personal history of nicotine dependence: Secondary | ICD-10-CM | POA: Insufficient documentation

## 2017-03-10 DIAGNOSIS — E039 Hypothyroidism, unspecified: Secondary | ICD-10-CM | POA: Insufficient documentation

## 2017-03-10 DIAGNOSIS — R079 Chest pain, unspecified: Secondary | ICD-10-CM

## 2017-03-10 DIAGNOSIS — R0789 Other chest pain: Secondary | ICD-10-CM | POA: Diagnosis present

## 2017-03-10 LAB — HEPATIC FUNCTION PANEL
ALT: 23 U/L (ref 14–54)
AST: 26 U/L (ref 15–41)
Albumin: 3.9 g/dL (ref 3.5–5.0)
Alkaline Phosphatase: 55 U/L (ref 38–126)
Bilirubin, Direct: 0.1 mg/dL (ref 0.1–0.5)
Indirect Bilirubin: 0.5 mg/dL (ref 0.3–0.9)
Total Bilirubin: 0.6 mg/dL (ref 0.3–1.2)
Total Protein: 6.7 g/dL (ref 6.5–8.1)

## 2017-03-10 LAB — CBC
HCT: 42.4 % (ref 36.0–46.0)
Hemoglobin: 13.8 g/dL (ref 12.0–15.0)
MCH: 30.5 pg (ref 26.0–34.0)
MCHC: 32.5 g/dL (ref 30.0–36.0)
MCV: 93.8 fL (ref 78.0–100.0)
Platelets: 279 10*3/uL (ref 150–400)
RBC: 4.52 MIL/uL (ref 3.87–5.11)
RDW: 13.6 % (ref 11.5–15.5)
WBC: 6.9 10*3/uL (ref 4.0–10.5)

## 2017-03-10 LAB — I-STAT TROPONIN, ED
TROPONIN I, POC: 0 ng/mL (ref 0.00–0.08)
Troponin i, poc: 0 ng/mL (ref 0.00–0.08)

## 2017-03-10 LAB — BASIC METABOLIC PANEL WITH GFR
Anion gap: 9 (ref 5–15)
BUN: 16 mg/dL (ref 6–20)
CO2: 25 mmol/L (ref 22–32)
Calcium: 9.4 mg/dL (ref 8.9–10.3)
Chloride: 108 mmol/L (ref 101–111)
Creatinine, Ser: 0.98 mg/dL (ref 0.44–1.00)
GFR calc Af Amer: >60 mL/min (ref >60)
GFR calc non Af Amer: >60 mL/min (ref >60)
Glucose, Bld: 104 mg/dL — ABNORMAL HIGH (ref 65–99)
Potassium: 4.2 mmol/L (ref 3.5–5.1)
Sodium: 142 mmol/L (ref 135–145)

## 2017-03-10 LAB — LIPASE, BLOOD: Lipase: 45 U/L (ref 11–51)

## 2017-03-10 NOTE — ED Notes (Signed)
Pt states, "I was told I had MS about 15 years ago, but ive never really had any symptoms of it so I don't know".

## 2017-03-10 NOTE — ED Triage Notes (Signed)
Pt arrives to ED from home with complaints of general malaise, left sided chest pain that radiates to left arm and face, intermittent nausea, and sob. Denies diaphoresis

## 2017-03-10 NOTE — ED Provider Notes (Signed)
[facility] Note   CSN: [id number] Arrival date & time: 03/10/17  1421     History   Chief Complaint Chief Complaint  Patient presents with  . Chest Pain    HPI Sheryl MemosSherell Turner is a 61 y.o. female.  The history is provided by the patient and a relative.  Chest Pain   This is a new problem. The current episode started 2 days ago. The problem occurs constantly. The problem has not changed since onset.The pain is associated with rest. Pain location: central and left chest. The pain is mild. The quality of the pain is described as dull. The pain radiates to the left shoulder and left arm. Exacerbated by: nothing. Associated symptoms include numbness and shortness of breath. Pertinent negatives include no abdominal pain, no back pain, no cough, no fever, no headaches, no nausea, no palpitations and no vomiting. Associated symptoms comments: Left neck pain, left facial numbness. She has tried nothing for the symptoms. Risk factors include obesity.  Her past medical history is significant for hyperlipidemia and thyroid problem.  Her family medical history is significant for CAD.    Past Medical History:  Diagnosis Date  . GERD (gastroesophageal reflux disease)   . H/O hiatal hernia   . Hyperthyroidism   . Hypothyroidism   . Jaundice 09/2012   noticed in eyes, /w obstruction   . Pancreatitis    09-14-12 recent Hospital stay [facility]  . Thyroid disease     Patient Active Problem List   Diagnosis Date Noted  . Thyroid disease     Past Surgical History:  Procedure Laterality Date  . ABDOMINAL HYSTERECTOMY    . APPENDECTOMY    . CESAREAN SECTION    . CHOLECYSTECTOMY N/A 11/26/2012   Procedure: LAPAROSCOPIC CHOLECYSTECTOMY WITH INTRAOPERATIVE CHOLANGIOGRAM;  Surgeon: Atilano InaEric M Wilson, MD;  [facility];  Service: General;  Laterality: N/A;  . ERCP N/A 09/16/2012   Procedure: ENDOSCOPIC RETROGRADE CHOLANGIOPANCREATOGRAPHY (ERCP);  Surgeon:  Petra KubaMarc E Magod, MD;  [facility];  Service: Endoscopy;  Laterality: N/A;  . ERCP N/A 10/20/2012   Procedure: ENDOSCOPIC RETROGRADE CHOLANGIOPANCREATOGRAPHY (ERCP);  Surgeon: Petra KubaMarc E Magod, MD;  [facility];  Service: Endoscopy;  Laterality: N/A;  . EUS Left 09/15/2012   Procedure: UPPER ENDOSCOPIC ULTRASOUND (EUS) RADIAL, possible LINEAR to follow, possible side-viewing endoscope;  Surgeon: Willis ModenaWilliam Outlaw, MD;  Location: [facility];  Service: Endoscopy;  Laterality: Left;  . SPYGLASS CHOLANGIOSCOPY N/A 10/20/2012   Procedure: [id number] CHOLANGIOSCOPY;  Surgeon: Petra KubaMarc E Magod, MD;  Location: [facility];  Service: Endoscopy;  Laterality: N/A;  . TUBAL LIGATION      OB History    No data available       Home Medications    Prior to Admission medications   Medication Sig Start Date End Date Taking? Authorizing Provider  cyclobenzaprine (FLEXERIL) 10 MG tablet Take 10 mg by mouth 3 (three) times daily as needed for muscle spasms.  01/15/15  Yes [provider]  fluticasone (FLONASE) 50 MCG/ACT nasal spray Place 2 sprays into both nostrils daily as needed for allergies or rhinitis.  10/28/16 10/28/17 Yes [provider]  levothyroxine (SYNTHROID, LEVOTHROID) 150 MCG tablet Take 150 mcg by mouth daily before breakfast.   Yes [provider]  pantoprazole (PROTONIX) 40 MG tablet Take 40 mg by mouth daily before breakfast. 09/19/12  Yes Ghimire, Werner LeanShanker M, MD  Probiotic Product (PROBIOTIC PO) Take 1 capsule by mouth daily.   Yes [provider]  triamcinolone cream (KENALOG) 0.1 % Apply 1 application topically daily as needed (for itchiness on scalp).  08/25/16  Yes [provider]  levothyroxine (SYNTHROID, LEVOTHROID) 100 MCG tablet Take 1 tablet (100 mcg total) by mouth daily before breakfast. Patient not taking: Reported on 03/10/2017 12/16/12   [name], Eric, MD    Family History No family history on file.  Social History Social History    Tobacco Use  . Smoking status: Former Smoker    Packs/day: 1.00    Years: 22.00    Pack years: 22.00    Types: Cigarettes    Last attempt to quit: 10/06/1998    Years since quitting: 18.4  . Smokeless tobacco: Never Used  Substance Use Topics  . Alcohol use: No  . Drug use: No     Allergies   Darvocet [propoxyphene n-acetaminophen]; Penicillins; Lipitor [atorvastatin]; and Adhesive [tape]   Review of Systems Review of Systems  Constitutional: Negative for chills and fever.  HENT: Negative for rhinorrhea and sore throat.   Eyes: Negative for visual disturbance.  Respiratory: Positive for shortness of breath. Negative for cough.   Cardiovascular: Positive for chest pain. Negative for palpitations.  Gastrointestinal: Negative for abdominal pain, nausea and vomiting.  Genitourinary: Negative for dysuria.  Musculoskeletal: Positive for neck pain. Negative for arthralgias and back pain.  Skin: Negative for color change and rash.  Allergic/Immunologic: Negative for immunocompromised state.  Neurological: Positive for numbness. Negative for syncope and headaches.  Psychiatric/Behavioral: Negative for confusion.  All other systems reviewed and are negative.    Physical Exam Updated Vital Signs BP (!) 139/59   Pulse 68   Temp (!) 97.5 F (36.4 C) (Oral)   Resp 18   Ht 5\' 4"  (1.626 m)   Wt 90.7 kg (200 lb)   SpO2 100%   BMI 34.33 kg/m   Physical Exam  Constitutional: She is oriented to person, place, and time. She appears well-developed and well-nourished. No distress.  HENT:  Head: Normocephalic and atraumatic.  Eyes: Conjunctivae and EOM are normal. Pupils are equal, round, and reactive to light.  Neck: Normal range of motion. Neck supple.  No midline cervical TTP or step-offs  Cardiovascular: Normal rate and regular rhythm.  No murmur heard. Pulmonary/Chest: Effort normal and breath sounds normal. No respiratory distress.  Abdominal: Soft. There is no  tenderness.  Musculoskeletal: Normal range of motion. She exhibits no edema.  Neurological: She is alert and oriented to person, place, and time.  Isolated numbness in the left V2 distribution but remainder of CN 2-12 grossly intact, 5/5 strength in all 4ext, no focal sensory deficits  Skin: Skin is warm and dry.  Psychiatric: She has a normal mood and affect.  Nursing note and vitals reviewed.    ED Treatments / Results  Labs (all labs ordered are listed, but only abnormal results are displayed) Labs Reviewed  BASIC METABOLIC PANEL - Abnormal; Notable for the following components:      Result Value   Glucose, Bld 104 (*)    All other components within normal limits  CBC  HEPATIC FUNCTION PANEL  LIPASE, BLOOD  I-STAT TROPONIN, ED  I-STAT TROPONIN, ED    EKG  EKG Interpretation  Date/Time:  Tuesday March 10 2017 14:32:08 EST Ventricular Rate:  81 PR Interval:  132 QRS Duration: 80 QT Interval:  398 QTC Calculation: 462 R Axis:   -29 Text Interpretation:  Normal sinus rhythm Possible Left atrial enlargement Nonspecific T wave abnormality Prolonged QT  Abnormal ECG No significant change since last tracing Confirmed by Richardean Canal ([id number]) on 03/10/2017 4:54:25 PM       Radiology Dg Chest 2 View  Result Date: 03/10/2017 CLINICAL DATA:  Malaise and left-sided chest pain radiating into left arm. EXAM: CHEST  2 VIEW COMPARISON:  01/26/2015 FINDINGS: The heart size and mediastinal contours are within normal limits. There is no evidence of pulmonary edema, consolidation, pneumothorax, nodule or pleural fluid. The visualized skeletal structures are unremarkable. IMPRESSION: No active cardiopulmonary disease. Electronically Signed   By: Irish Lack M.D.   On: 03/10/2017 17:27    Procedures Procedures (including critical care time)  Medications Ordered in ED Medications - No data to display   Initial Impression / Assessment and Plan / ED Course  I have reviewed the  triage vital signs and the nursing notes.  Pertinent labs & imaging results that were available during my care of the patient were reviewed by me and considered in my medical decision making (see chart for details).     Pt with h/o hypothyroidism, GERD presents with chest pain. Pt says for the last 2 days she has had dull ache that is localized to the substernal region and left chest with radiation to the left shoulder/left upper arm/left neck that is mild in severity, constant, and associated with mild shortness of breath.  She also notes that  today noticed that she had some numbness on the left side of her face, which is what prompted her visit.  Her husband recently had an emergent cath, and she says that cardiac disease and stroke runs in her family.  Denies fever, chills, headache, lightheadedness, nausea, vomiting, diarrhea, urinary symptoms, recent illness.  VS & exam as above. EKG: NSR @ 81bpm w/prolonged QTc ( ) and no signs of ischemia.  Low risk per HEAR score.  CXR WNL. Labs unremarkable, including 2x undetectable troponins.  Cause of the patient's symptoms unclear, but doubt emergent etiology given history, exam, and workup.  Explained all results to the Pt. Will discharge the Pt home. Recommending follow-up with PCP. ED return precautions provided. Pt acknowledged understanding of, and concurrence with the plan. All questions answered to her satisfaction. In stable condition at the time of discharge.  Final Clinical Impressions(s) / ED Diagnoses   Final diagnoses:  Chest pain, unspecified type    ED Discharge Orders    None       Forest Becker, MD 03/10/17 Silva Bandy    Charlynne Pander, MD 03/12/17 [phone]

## 2017-03-10 NOTE — ED Notes (Signed)
Phlebotomy @ bedside  

## 2017-03-10 NOTE — ED Notes (Signed)
Patient verbalizes understanding of discharge instructions. Opportunity for questioning and answers were provided. Armband removed by staff, pt discharged from ED ambulatory with female companion.  

## 2017-04-02 ENCOUNTER — Other Ambulatory Visit: Payer: Self-pay | Admitting: Gastroenterology

## 2017-04-02 DIAGNOSIS — R140 Abdominal distension (gaseous): Secondary | ICD-10-CM

## 2017-04-03 ENCOUNTER — Other Ambulatory Visit: Payer: Self-pay | Admitting: Gastroenterology

## 2017-04-03 DIAGNOSIS — R140 Abdominal distension (gaseous): Secondary | ICD-10-CM

## 2017-04-03 DIAGNOSIS — R143 Flatulence: Secondary | ICD-10-CM

## 2017-04-07 ENCOUNTER — Ambulatory Visit
Admission: RE | Admit: 2017-04-07 | Discharge: 2017-04-07 | Disposition: A | Discharge status: Home / Self Care | Payer: BLUE CROSS/BLUE SHIELD | Source: Ambulatory Visit | Attending: Gastroenterology | Admitting: Gastroenterology

## 2017-04-07 DIAGNOSIS — R140 Abdominal distension (gaseous): Secondary | ICD-10-CM

## 2017-04-07 DIAGNOSIS — R143 Flatulence: Secondary | ICD-10-CM

## 2017-11-21 ENCOUNTER — Other Ambulatory Visit: Payer: Self-pay

## 2017-11-21 ENCOUNTER — Encounter (HOSPITAL_COMMUNITY): Payer: Self-pay | Admitting: Emergency Medicine

## 2017-11-21 ENCOUNTER — Emergency Department (HOSPITAL_COMMUNITY)
Admission: EM | Admit: 2017-11-21 | Discharge: 2017-11-21 | Disposition: A | Discharge status: Home / Self Care | Payer: BLUE CROSS/BLUE SHIELD | Attending: Emergency Medicine | Admitting: Emergency Medicine

## 2017-11-21 DIAGNOSIS — Z79899 Other long term (current) drug therapy: Secondary | ICD-10-CM | POA: Diagnosis not present

## 2017-11-21 DIAGNOSIS — E039 Hypothyroidism, unspecified: Secondary | ICD-10-CM | POA: Insufficient documentation

## 2017-11-21 DIAGNOSIS — M25562 Pain in left knee: Secondary | ICD-10-CM | POA: Diagnosis present

## 2017-11-21 DIAGNOSIS — Z87891 Personal history of nicotine dependence: Secondary | ICD-10-CM | POA: Insufficient documentation

## 2017-11-21 MED ORDER — PREDNISONE 10 MG (21) PO TBPK: ORAL_TABLET | Freq: Every day | ORAL | 0 refills | Status: DC

## 2017-11-21 MED ORDER — HYDROCODONE-ACETAMINOPHEN 5-325 MG PO TABS
1.0000 | ORAL_TABLET | Freq: Once | ORAL | Status: AC
  Administered 2017-11-21: 1 via ORAL
  Filled 2017-11-21: qty 1

## 2017-11-21 MED ORDER — HYDROCODONE-ACETAMINOPHEN 5-325 MG PO TABS: 1.0000 | ORAL_TABLET | Freq: Four times a day (QID) | ORAL | 0 refills | Status: DC | PRN

## 2017-11-21 NOTE — ED Provider Notes (Signed)
[facility] EMERGENCY DEPARTMENT Provider Note   CSN: [id number] Arrival date & time: 11/21/17  1922     History   Chief Complaint Chief Complaint  Patient presents with  . Knee Pain    HPI Sheryl Turner is a 62 y.o. female with history of GERD, thyroid disease presents for evaluation of acute onset, progressively worsening left knee pain for 2 weeks.  Denies any known trauma or falls but notes that her left knee has "giving me trouble "intermittently for several years.  She typically notices this pain when going up her porch steps or carrying heavy groceries.  Sometimes feels that the knee is "catching".  Pain is aching and throbbing, located primarily to the lateral aspect of the left knee radiating to the posterior aspect of the left knee.  She notes swelling.  Pain worsens with flexion.  She states that she has not been able to bear weight on it since yesterday at dinner.  Denies numbness or tingling.  Pain will radiate up the left thigh occasionally.  Denies fevers or vomiting but she notes she is mildly nauseous when the pain worsens.  She has tried Aleve without significant relief of her symptoms.  Went to her PCP yesterday who performed an x-ray which showed no evidence of acute osseous abnormality.  Chart review notes that PCP thinks her symptoms could be reflective of a meniscal tear and patient is in the process of being set up for a steroid injection with her PCP.  The history is provided by the patient.    Past Medical History:  Diagnosis Date  . GERD (gastroesophageal reflux disease)   . H/O hiatal hernia   . Hyperthyroidism   . Hypothyroidism   . Jaundice 09/2012   noticed in eyes, /w obstruction   . Pancreatitis    09-14-12 recent Hospital stay [facility]  . Thyroid disease     Patient Active Problem List   Diagnosis Date Noted  . Thyroid disease     Past Surgical History:  Procedure Laterality Date  . ABDOMINAL HYSTERECTOMY    . APPENDECTOMY    .  CESAREAN SECTION    . CHOLECYSTECTOMY N/A 11/26/2012   Procedure: LAPAROSCOPIC CHOLECYSTECTOMY WITH INTRAOPERATIVE CHOLANGIOGRAM;  Surgeon: Atilano Ina, MD;  Location: [facility] OR;  Service: General;  Laterality: N/A;  . ERCP N/A 09/16/2012   Procedure: ENDOSCOPIC RETROGRADE CHOLANGIOPANCREATOGRAPHY (ERCP);  Surgeon: Petra Kuba, MD;  Location: [facility] OR;  Service: Endoscopy;  Laterality: N/A;  . ERCP N/A 10/20/2012   Procedure: ENDOSCOPIC RETROGRADE CHOLANGIOPANCREATOGRAPHY (ERCP);  Surgeon: Petra Kuba, MD;  Location: [facility];  Service: Endoscopy;  Laterality: N/A;  . EUS Left 09/15/2012   Procedure: UPPER ENDOSCOPIC ULTRASOUND (EUS) RADIAL, possible LINEAR to follow, possible side-viewing endoscope;  Surgeon: Willis Modena, MD;  Location: [facility] ENDOSCOPY;  Service: Endoscopy;  Laterality: Left;  . SPYGLASS CHOLANGIOSCOPY N/A 10/20/2012   Procedure: [id number] CHOLANGIOSCOPY;  Surgeon: Petra Kuba, MD;  Location: [facility] ENDOSCOPY;  Service: Endoscopy;  Laterality: N/A;  . TUBAL LIGATION       OB History   None      Home Medications    Prior to Admission medications   Medication Sig Start Date End Date Taking? Authorizing Provider  cyclobenzaprine (FLEXERIL) 10 MG tablet Take 10 mg by mouth 3 (three) times daily as needed for muscle spasms.  01/15/15   [provider]  fluticasone (FLONASE) 50 MCG/ACT nasal spray Place 2 sprays into both nostrils daily as needed for  allergies or rhinitis.  10/28/16 10/28/17  [provider]  HYDROcodone-acetaminophen (NORCO/VICODIN) 5-325 MG tablet Take 1 tablet by mouth every 6 (six) hours as needed for severe pain. 11/21/17   Michela Pitcher A, PA-C  levothyroxine (SYNTHROID, LEVOTHROID) 100 MCG tablet Take 1 tablet (100 mcg total) by mouth daily before breakfast. Patient not taking: Reported on 03/10/2017 12/16/12   Gaynelle Adu, MD  levothyroxine (SYNTHROID, LEVOTHROID) 150 MCG tablet Take 150 mcg by mouth daily before breakfast.    [provider]   pantoprazole (PROTONIX) 40 MG tablet Take 40 mg by mouth daily before breakfast. 09/19/12   Ghimire, Werner Lean, MD  predniSONE (STERAPRED UNI-PAK 21 TAB) 10 MG (21) TBPK tablet Take by mouth daily. Take 6 tabs by mouth daily  for 2 days, then 5 tabs for 2 days, then 4 tabs for 2 days, then 3 tabs for 2 days, 2 tabs for 2 days, then 1 tab by mouth daily for 2 days 11/21/17   Michela Pitcher A, PA-C  Probiotic Product (PROBIOTIC PO) Take 1 capsule by mouth daily.    [provider]  triamcinolone cream (KENALOG) 0.1 % Apply 1 application topically daily as needed (for itchiness on scalp).  08/25/16   [provider]    Family History No family history on file.  Social History Social History   Tobacco Use  . Smoking status: Former Smoker    Packs/day: 1.00    Years: 22.00    Pack years: 22.00    Types: Cigarettes    Last attempt to quit: 10/06/1998    Years since quitting: 19.1  . Smokeless tobacco: Never Used  Substance Use Topics  . Alcohol use: No  . Drug use: No     Allergies   Darvocet [propoxyphene n-acetaminophen]; Penicillins; Lipitor [atorvastatin]; and Adhesive [tape]   Review of Systems Review of Systems  Constitutional: Negative for chills and fever.  Musculoskeletal: Positive for arthralgias, gait problem and joint swelling.  Neurological: Negative for weakness and numbness.     Physical Exam Updated Vital Signs BP (!) 162/77   Pulse 70   Temp 98.1 F (36.7 C) (Oral)   Resp 16   Ht 5\' 3"  (1.6 m)   Wt 95.3 kg   SpO2 97%   BMI 37.20 kg/m   Physical Exam  Constitutional: She appears well-developed and well-nourished. No distress.  HENT:  Head: Normocephalic and atraumatic.  Eyes: Conjunctivae are normal. Right eye exhibits no discharge. Left eye exhibits no discharge.  Neck: No JVD present. No tracheal deviation present.  Cardiovascular: Normal rate and intact distal pulses.  2+  DP/PT pulses bilaterally, Homans sign absent bilaterally, no  lower extremity edema, no palpable cords, compartments are soft   Pulmonary/Chest: Effort normal.  Abdominal: She exhibits no distension.  Musculoskeletal: She exhibits no edema.       Left knee: She exhibits decreased range of motion. She exhibits no ecchymosis, no deformity, no laceration, no erythema, normal alignment, no LCL laxity, no bony tenderness, normal meniscus and no MCL laxity. Tenderness found. Lateral joint line and LCL tenderness noted.  5/5 strength of BLE major muscle groups.  Decreased active range of motion of the left knee secondary to pain.  Patient able to extend the left knee against gravity without difficulty.  No quadriceps tendon deformity.  No varus or valgus instability, negative anterior/posterior drawer test.  Neurological: She is alert.  Fluent speech, facial droop, sensation intact to soft touch of bilateral lower extremity's.  Ambulate with  an antalgic gait keeping her left leg completely extended but able to bear weight.  Skin: Skin is warm and dry. Capillary refill takes less than 2 seconds. No erythema.  Psychiatric: She has a normal mood and affect. Her behavior is normal.  Nursing note and vitals reviewed.    ED Treatments / Results  Labs (all labs ordered are listed, but only abnormal results are displayed) Labs Reviewed - No data to display  EKG None  Radiology No results found.  Procedures Procedures (including critical care time)  Medications Ordered in ED Medications  HYDROcodone-acetaminophen (NORCO/VICODIN) 5-325 MG per tablet 1 tablet (1 tablet Oral Given 11/21/17 2208)     Initial Impression / Assessment and Plan / ED Course  I have reviewed the triage vital signs and the nursing notes.  Pertinent labs & imaging results that were available during my care of the patient were reviewed by me and considered in my medical decision making (see chart for details).     Patient with progressively worsening left knee pain for 2 weeks.   Seen and evaluated by her PCP yesterday with negative x-rays, chart review shows patient's PCP thought her symptoms could be related to meniscal tear.  The patient is afebrile, hypertensive although she does appear to be in a considerable amount of pain which could be contributing.  She is neurovascularly intact.  Able to bear weight on the extremity however painful.  No constitutional symptoms, doubt septic joint, gout, osteomyelitis, no signs of secondary skin infection or DVT.  Her PCP is in the process of setting her up with a steroid injection on an outpatient basis.  RICE therapy indicated and discussed with patient.  Will discharge with knee immobilizer as she is more comfortable keeping her knee in an extended position.  She has a cane at home.  Recommend follow-up with PCP or orthopedist on an outpatient basis.  Will give small amount of hydrocodone for severe pain, [place] controlled substance registry was queried.  Discussed strict ED return precautions.  Discussed strict ED return precautions.  Patient and patient's husband verbalized understanding of and agreement with plan and patient is stable for discharge home at this time.  Final Clinical Impressions(s) / ED Diagnoses   Final diagnoses:  Acute pain of left knee    ED Discharge Orders         Ordered    predniSONE (STERAPRED UNI-PAK 21 TAB) 10 MG (21) TBPK tablet  Daily     11/21/17 2154    HYDROcodone-acetaminophen (NORCO/VICODIN) 5-325 MG tablet  Every 6 hours PRN     11/21/17 2154           Jeanie Sewer, PA-C 11/22/17 1724    Margarita Grizzle, MD 11/23/17 2017

## 2017-11-21 NOTE — ED Notes (Signed)
Pt verbalizes understanding of d/c instructions. Prescriptions reviewed with patient. Pt taken to lobby in wheelchair at d/c with all belongings and with family.   

## 2017-11-21 NOTE — ED Notes (Signed)
See EDP assessment 

## 2017-11-21 NOTE — Discharge Instructions (Addendum)
1. Medications: Take steroid taper as prescribed with food.  Do not take ibuprofen, Advil, Aleve, or Motrin while taking this medication but you may take [phone] mg of Tylenol every 6 hours as needed for pain. Do not exceed 4000 mg of Tylenol daily.  You can take Norco as needed for severe pain but do not drive, drink alcohol, or operate heavy machinery while taking this medicine as it may make you drowsy.  I recommend only taking this medicine at night to help you get to sleep.  You can also cut these tablets in half.   2. Treatment: rest, ice, elevate and use brace, drink plenty of fluids, gentle stretching 3. Follow Up: Please followup with orthopedics as directed or your PCP in 3-5 days if no improvement for discussion of your diagnoses and further evaluation after today's visit; if you do not have a primary care doctor use the resource guide provided to find one; Please return to the ER for worsening symptoms or other concerns such as worsening swelling, redness of the skin, fevers, loss of pulses, or loss of feeling

## 2017-11-21 NOTE — ED Triage Notes (Signed)
Pt reports left knee pain that started two weeks ago. Pt reports having a scan done yesterday and it was negative for fx. Pt reports she was ambulatory yesterday but today she reports she can hardly walk now d/t pain. Pt reports taking Aleve with no relief. Denies any known injuries.

## 2017-12-15 ENCOUNTER — Other Ambulatory Visit: Payer: Self-pay | Admitting: Gastroenterology

## 2017-12-15 DIAGNOSIS — R1310 Dysphagia, unspecified: Secondary | ICD-10-CM

## 2017-12-17 ENCOUNTER — Ambulatory Visit
Admission: RE | Admit: 2017-12-17 | Discharge: 2017-12-17 | Disposition: A | Discharge status: Home / Self Care | Payer: BLUE CROSS/BLUE SHIELD | Source: Ambulatory Visit | Attending: Gastroenterology | Admitting: Gastroenterology

## 2017-12-17 DIAGNOSIS — R1310 Dysphagia, unspecified: Secondary | ICD-10-CM

## 2018-05-03 IMAGING — US US PELVIS COMPLETE TRANSABD/TRANSVAG
1 series · 14 of 25 positions shown · non-contrast
Comparison: Prior CT from 10/22/2012.

CLINICAL DATA: Initial evaluation for gas and bloating. History of
prior hysterectomy.

EXAM:
TRANSABDOMINAL AND TRANSVAGINAL ULTRASOUND OF PELVIS
TECHNIQUE: Both transabdominal and transvaginal ultrasound examinations of the
pelvis were performed. Transabdominal technique was performed for
global imaging of the pelvis including uterus, ovaries, adnexal
regions, and pelvic cul-de-sac. It was necessary to proceed with
endovaginal exam following the transabdominal exam to visualize the
uterus and ovaries.

[Series 1: us pelvis complete transabd/transvag · 0.30mm/px · 14 of 29 slices shown]
[im 1/29]
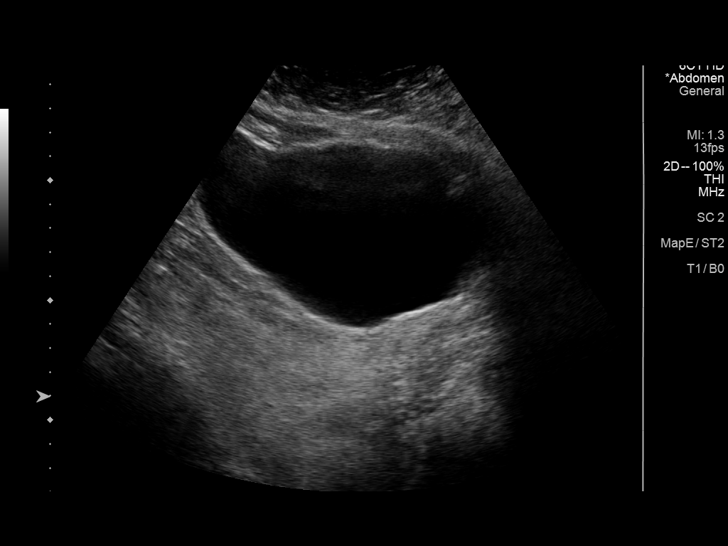
[im 3/29]
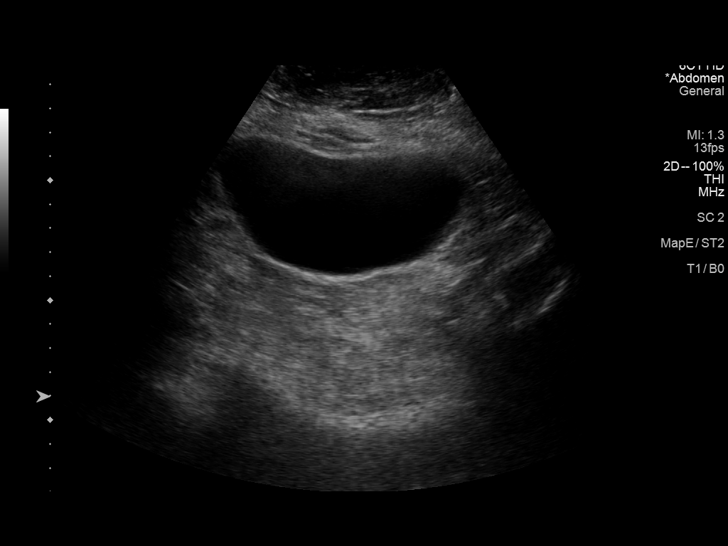
[im 5/29]
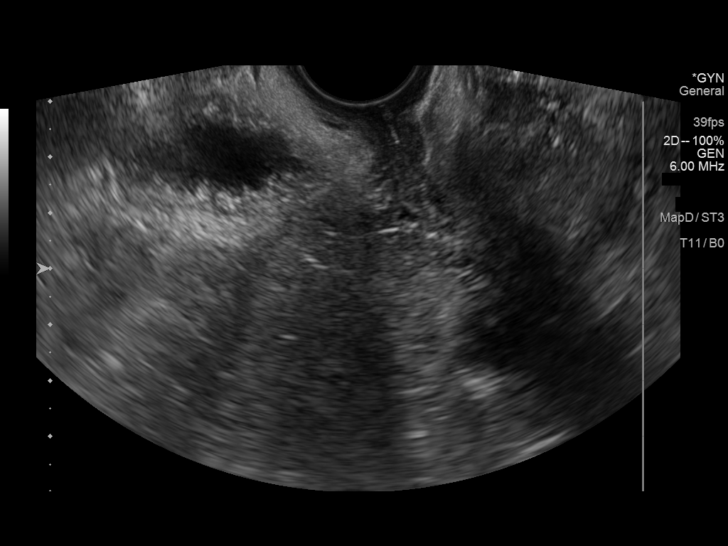
[im 8/29]
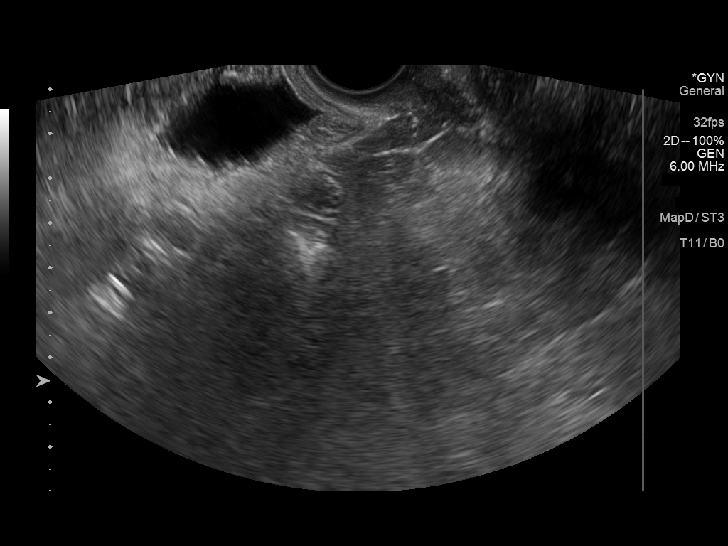
[im 10/29]
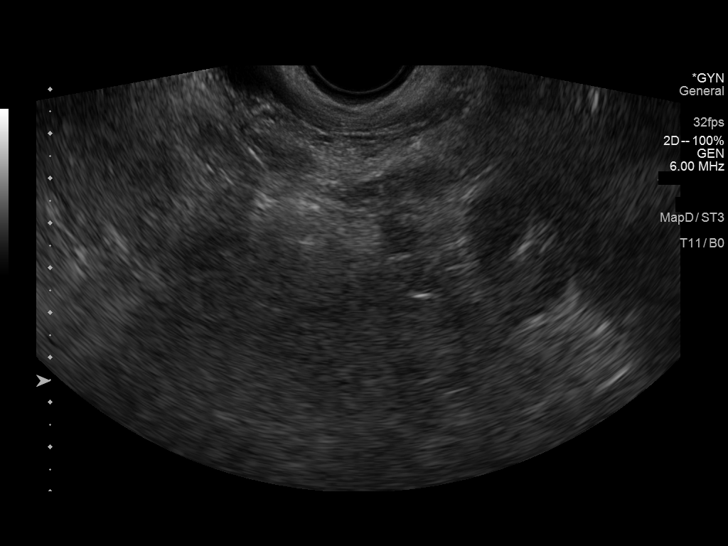
[im 11/29]
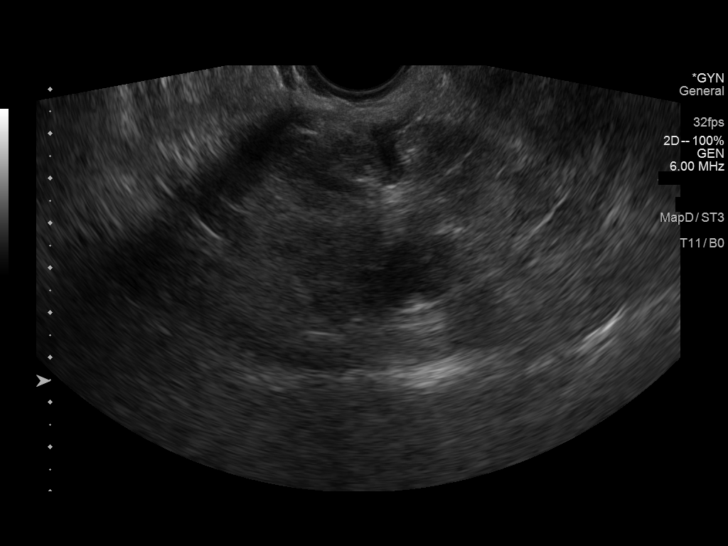
[im 13/29]
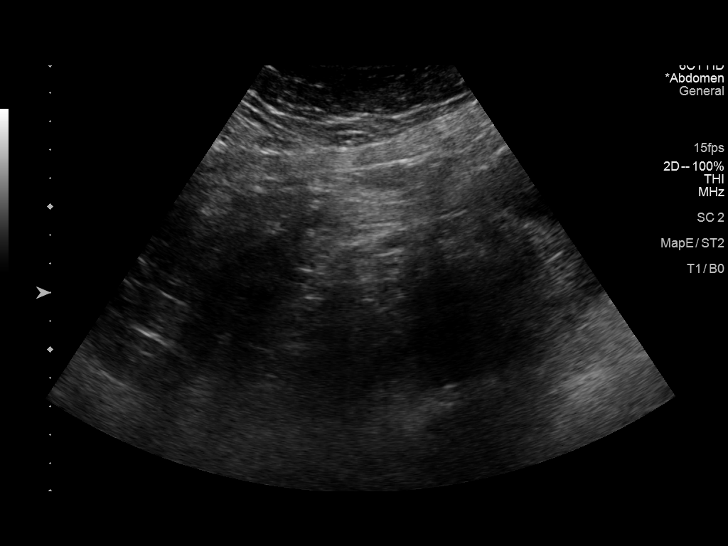
[im 16/29]
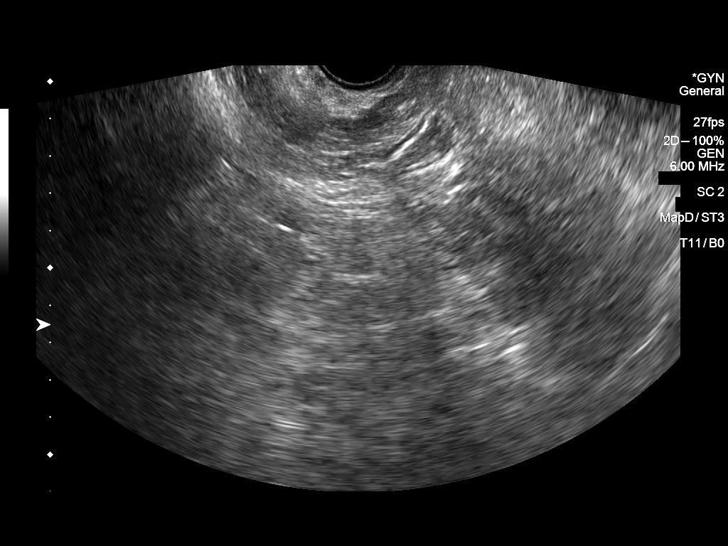
[im 18/29]
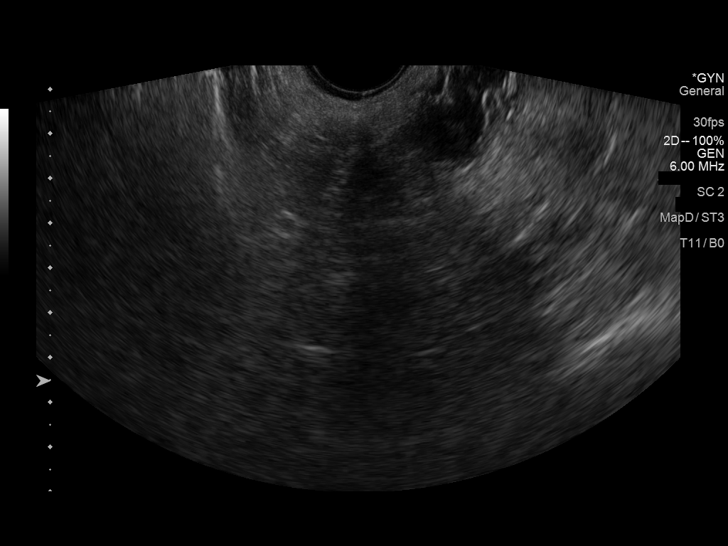
[im 19/29]
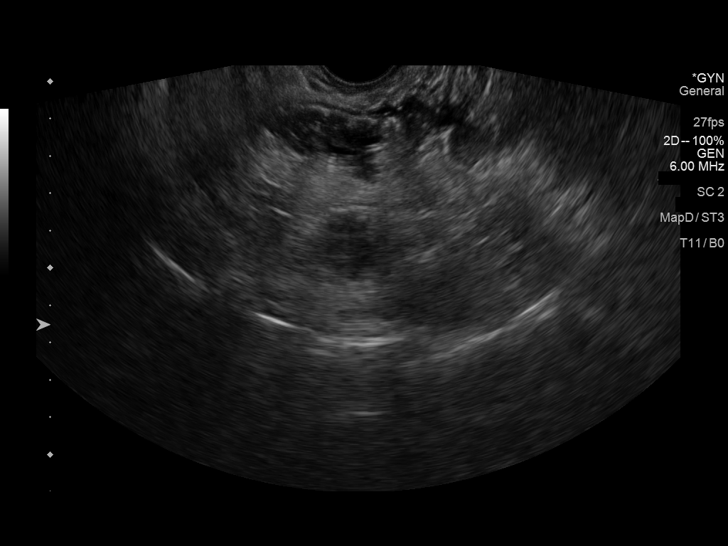
[im 22/29]
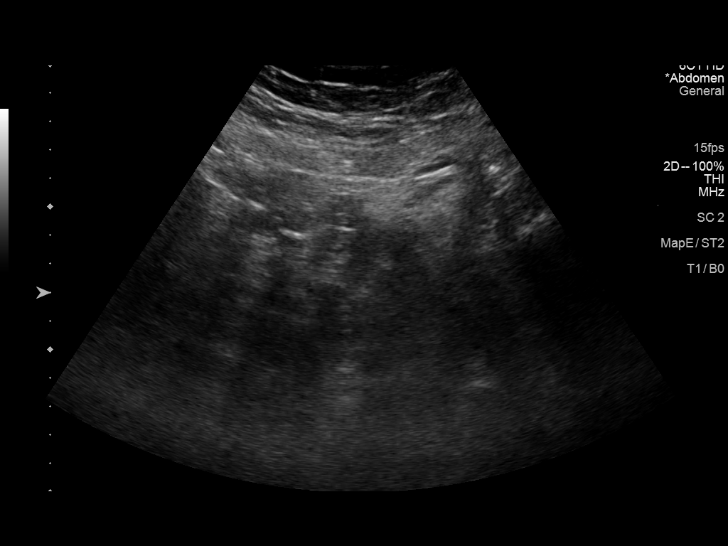
[im 24/29]
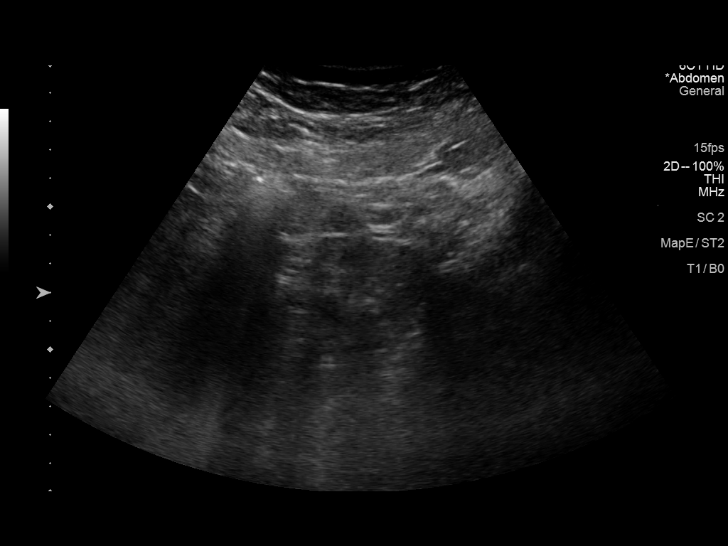
[im 26/29]
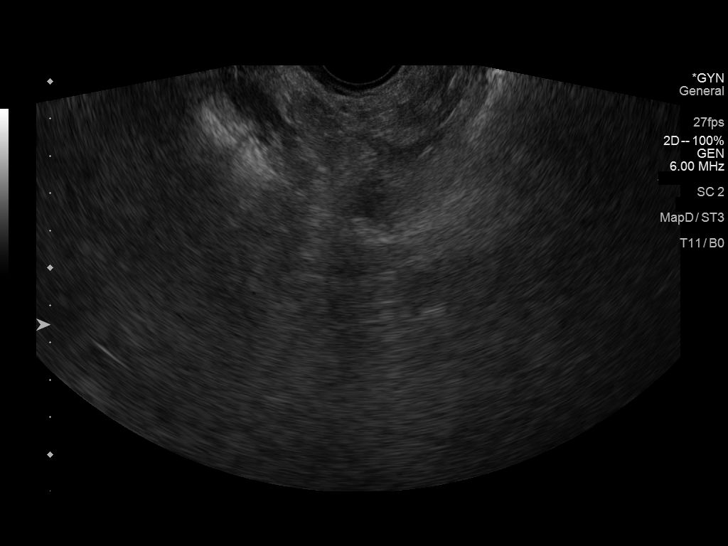
[im 29/29]
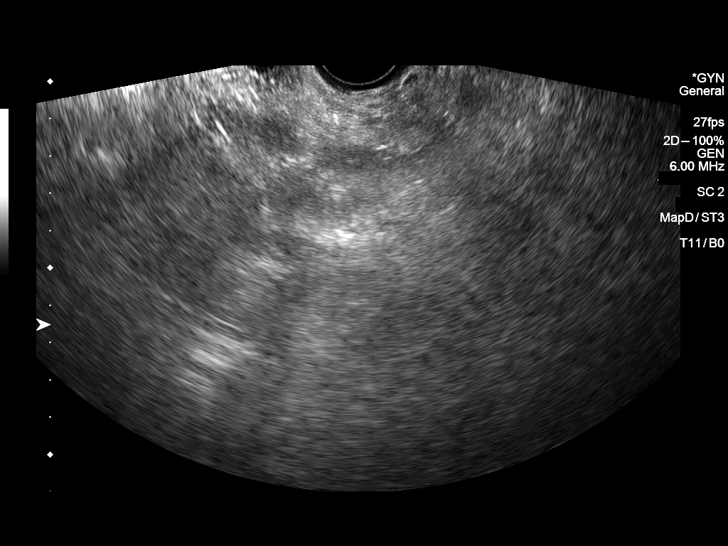

[14 of 25 positions shown; findings below may reference images not displayed]

FINDINGS: Uterus

Surgically absent.  No abnormality about the vaginal cuff.

Endometrium

Surgically absent.

Right ovary

Not visualized.  No adnexal mass.

Left ovary

Not visualized.  No adnexal mass.

Other findings

No abnormal free fluid.
IMPRESSION: Status post hysterectomy with nonvisualization of the uterus and
ovaries. No adnexal mass or other abnormality identified within the
pelvis.

## 2021-04-02 DIAGNOSIS — J019 Acute sinusitis, unspecified: Secondary | ICD-10-CM | POA: Diagnosis not present

## 2021-05-23 DIAGNOSIS — H6503 Acute serous otitis media, bilateral: Secondary | ICD-10-CM | POA: Diagnosis not present

## 2021-05-23 DIAGNOSIS — Z789 Other specified health status: Secondary | ICD-10-CM | POA: Diagnosis not present

## 2021-05-23 DIAGNOSIS — E7800 Pure hypercholesterolemia, unspecified: Secondary | ICD-10-CM | POA: Diagnosis not present

## 2021-06-03 DIAGNOSIS — E7800 Pure hypercholesterolemia, unspecified: Secondary | ICD-10-CM | POA: Diagnosis not present

## 2021-06-06 DIAGNOSIS — L219 Seborrheic dermatitis, unspecified: Secondary | ICD-10-CM | POA: Diagnosis not present

## 2021-06-06 DIAGNOSIS — L821 Other seborrheic keratosis: Secondary | ICD-10-CM | POA: Diagnosis not present

## 2021-06-06 DIAGNOSIS — D2272 Melanocytic nevi of left lower limb, including hip: Secondary | ICD-10-CM | POA: Diagnosis not present

## 2021-06-06 DIAGNOSIS — D1801 Hemangioma of skin and subcutaneous tissue: Secondary | ICD-10-CM | POA: Diagnosis not present

## 2021-06-06 DIAGNOSIS — D225 Melanocytic nevi of trunk: Secondary | ICD-10-CM | POA: Diagnosis not present

## 2021-06-06 DIAGNOSIS — D2271 Melanocytic nevi of right lower limb, including hip: Secondary | ICD-10-CM | POA: Diagnosis not present

## 2021-06-06 DIAGNOSIS — L906 Striae atrophicae: Secondary | ICD-10-CM | POA: Diagnosis not present

## 2021-06-06 DIAGNOSIS — L570 Actinic keratosis: Secondary | ICD-10-CM | POA: Diagnosis not present

## 2021-06-24 DIAGNOSIS — R0602 Shortness of breath: Secondary | ICD-10-CM | POA: Diagnosis not present

## 2021-06-24 DIAGNOSIS — E7800 Pure hypercholesterolemia, unspecified: Secondary | ICD-10-CM | POA: Diagnosis not present

## 2021-06-24 DIAGNOSIS — M7712 Lateral epicondylitis, left elbow: Secondary | ICD-10-CM | POA: Diagnosis not present

## 2021-06-24 DIAGNOSIS — Z789 Other specified health status: Secondary | ICD-10-CM | POA: Diagnosis not present

## 2021-07-16 ENCOUNTER — Ambulatory Visit: Payer: 59 | Admitting: Cardiology

## 2021-07-22 ENCOUNTER — Ambulatory Visit: Payer: 59 | Admitting: Cardiology

## 2021-07-22 ENCOUNTER — Encounter: Payer: Self-pay | Admitting: Cardiology

## 2021-07-22 VITALS — BP 123/83 | HR 77 | Temp 96.3°F | Resp 16 | Ht 63.0 in | Wt 224.0 lb

## 2021-07-22 DIAGNOSIS — E7801 Familial hypercholesterolemia: Secondary | ICD-10-CM | POA: Diagnosis not present

## 2021-07-22 DIAGNOSIS — Z789 Other specified health status: Secondary | ICD-10-CM | POA: Diagnosis not present

## 2021-07-22 DIAGNOSIS — Z6839 Body mass index (BMI) 39.0-39.9, adult: Secondary | ICD-10-CM | POA: Diagnosis not present

## 2021-07-22 DIAGNOSIS — Z87891 Personal history of nicotine dependence: Secondary | ICD-10-CM | POA: Diagnosis not present

## 2021-07-22 DIAGNOSIS — E7800 Pure hypercholesterolemia, unspecified: Secondary | ICD-10-CM | POA: Diagnosis not present

## 2021-07-22 DIAGNOSIS — R0602 Shortness of breath: Secondary | ICD-10-CM | POA: Diagnosis not present

## 2021-07-22 DIAGNOSIS — R072 Precordial pain: Secondary | ICD-10-CM

## 2021-07-22 DIAGNOSIS — I10 Essential (primary) hypertension: Secondary | ICD-10-CM | POA: Diagnosis not present

## 2021-07-22 MED ORDER — METOPROLOL SUCCINATE ER 50 MG PO TB24
50.0000 mg | ORAL_TABLET | Freq: Every day | ORAL | 0 refills | Status: DC
Start: 2021-07-22 — End: 2022-03-12

## 2021-07-22 NOTE — Progress Notes (Signed)
? ?ID:  Sheryl Turner, DOB 08-17-1955, MRN [id number] ? ?PCP:  Roderick Pee, PA  ?Cardiologist:  Tessa Lerner, DO, [facility] (established care 07/22/2021) ?Former Cardiology Providers: Dr. Seward Speck [facility]) ? ?REASON FOR CONSULT: Exertional shortness of breath ? ?REQUESTING PHYSICIAN:  ?Roderick Pee, PA ?[id number] [place] ?[place],  [place] [address] ? ?Chief Complaint  ?Patient presents with  ? Exertional SOB  ? New Patient (Initial Visit)  ?  Referred by Darel Hong, Pac  ? ? ?HPI  ?Sheryl Turner is a 65 y.o. Caucasian female whose past medical history and cardiovascular risk factors include: Statin intolerance, prediabetic, pure hypercholesterolemia, former smoker, hypertension, frequent PVCs, obesity due to excess calories, postmenopausal female. ? ?She is referred to the office at the request of Roderick Pee, PA for evaluation of exertional shortness of breath. ? ?Patient status is experiencing shortness of breath with effort related activities since September 2022.  The symptoms do improve with resting and because of her symptoms she has been modifying her activities so that she is not as short of breath.  She denies exertional chest pain.  However she does experience precordial discomfort on the left side, nonexertional, does not resolve with resting, self-limited, intensity 3 out of 10, nonradiating. ? ?She was evaluated by another cardiologist in the recent past underwent a GXT as well as an echocardiogram.  Due to ongoing symptoms she was supposed to undergo left heart catheterization to evaluate for CAD.  But is still pending.  She presents for second opinion. ? ?Patient has a history of pure hypercholesterolemia and likely familial.  She was on Repatha which helped her lipids significantly but due to insurance coverage is no longer on it.  She tried to be on Praluent but this was refused by her insurance company according to her.  She has statin intolerance. ? ?Given her shortness of  breath patient has not been evaluated by pulmonary medicine for other etiologies.  She is a former smoker who quit in 1999. ? ?FUNCTIONAL STATUS: ?No structured exercise program or daily routine.  ? ?ALLERGIES: ?Allergies  ?Allergen Reactions  ? Darvocet [Propoxyphene N-Acetaminophen] Nausea And Vomiting  ? Penicillins Other (See Comments)  ?  Orally = mouth ulcers ?Has patient had a PCN reaction causing immediate rash, facial/tongue/throat swelling, SOB or lightheadedness with hypotension: Yes ?Has patient had a PCN reaction causing severe rash involving mucus membranes or skin necrosis: No ?Has patient had a PCN reaction that required hospitalization: No ?Has patient had a PCN reaction occurring within the last 10 years: Yes ?If all of the above answers are "NO", then may proceed with Cephalosporin use. ? ?  ? Lipitor [Atorvastatin] Other (See Comments)  ?  Causes muscle soreness  ? Adhesive [Tape] Other (See Comments)  ?  Skin irritation   ? ? ?MEDICATION LIST PRIOR TO VISIT: ?Current Meds  ?Medication Sig  ? Cholecalciferol (VITAMIN D3 SUPER STRENGTH) 50 MCG (2000 UT) TABS Take 2,000 Units by mouth daily.  ? cyclobenzaprine (FLEXERIL) 10 MG tablet Take 10 mg by mouth 3 (three) times daily as needed for muscle spasms.   ? diclofenac (VOLTAREN) 75 MG EC tablet Take 1 tablet by mouth 2 (two) times daily.  ? fluticasone (FLONASE) 50 MCG/ACT nasal spray Place 2 sprays into both nostrils daily as needed for allergies or rhinitis.   ? ketoconazole (NIZORAL) 2 % shampoo Apply topically.  ? levothyroxine (SYNTHROID) 112 MCG tablet Take 1 tablet by mouth daily.  ? metoprolol succinate (  TOPROL XL) 50 MG 24 hr tablet Take 1 tablet (50 mg total) by mouth daily.  ? pantoprazole (PROTONIX) 40 MG tablet Take 40 mg by mouth daily before breakfast.  ? Probiotic Product (PROBIOTIC PO) Take 1 capsule by mouth daily.  ? Simethicone (GAS-X EXTRA STRENGTH PO) Gas-X  ? triamcinolone cream (KENALOG) 0.1 % Apply 1 application topically  daily as needed (for itchiness on scalp).   ? Triprolidine-Pseudoephedrine (ANTIHISTAMINE PO) allegra  ? [DISCONTINUED] metoprolol succinate (TOPROL-XL) 100 MG 24 hr tablet Take 100 mg by mouth daily.  ?  ? ?PAST MEDICAL HISTORY: ?Past Medical History:  ?Diagnosis Date  ? GERD (gastroesophageal reflux disease)   ? H/O hiatal hernia   ? Hyperlipidemia   ? Hyperthyroidism   ? Hypothyroidism   ? Jaundice 09/14/2012  ? noticed in eyes, /w obstruction   ? Pancreatitis   ? 09-14-12 recent Hospital stay [facility]  ? Thyroid disease   ? ? ?PAST SURGICAL HISTORY: ?Past Surgical History:  ?Procedure Laterality Date  ? ABDOMINAL HYSTERECTOMY    ? APPENDECTOMY    ? CESAREAN SECTION    ? CHOLECYSTECTOMY N/A 11/26/2012  ? Procedure: LAPAROSCOPIC CHOLECYSTECTOMY WITH INTRAOPERATIVE CHOLANGIOGRAM;  Surgeon: Atilano InaEric M Wilson, MD;  [facility];  Service: General;  Laterality: N/A;  ? ERCP N/A 09/16/2012  ? Procedure: ENDOSCOPIC RETROGRADE CHOLANGIOPANCREATOGRAPHY (ERCP);  Surgeon: Petra KubaMarc E Magod, MD;  [facility];  Service: Endoscopy;  Laterality: N/A;  ? ERCP N/A 10/20/2012  ? Procedure: ENDOSCOPIC RETROGRADE CHOLANGIOPANCREATOGRAPHY (ERCP);  Surgeon: Petra KubaMarc E Magod, MD;  [facility];  Service: Endoscopy;  Laterality: N/A;  ? EUS Left 09/15/2012  ? Procedure: UPPER ENDOSCOPIC ULTRASOUND (EUS) RADIAL, possible LINEAR to follow, possible side-viewing endoscope;  Surgeon: Willis ModenaWilliam Outlaw, MD;  Location: [facility];  Service: Endoscopy;  Laterality: Left;  ? SPYGLASS CHOLANGIOSCOPY N/A 10/20/2012  ? Procedure: SPYGLASS CHOLANGIOSCOPY;  Surgeon: Petra KubaMarc E Magod, MD;  Location: [facility];  Service: Endoscopy;  Laterality: N/A;  ? TUBAL LIGATION    ? ? ?FAMILY HISTORY: ?The patient family history includes Cerebral aneurysm in her mother; Heart failure in her brother. ? ?SOCIAL HISTORY:  ?The patient  reports that she quit smoking about 22 years ago. Her smoking use included cigarettes. She has a 22.00 pack-year smoking history. She has never  used smokeless tobacco. She reports that she does not drink alcohol and does not use drugs. ? ?REVIEW OF SYSTEMS: ?Review of Systems  ?Cardiovascular:  Positive for chest pain, dyspnea on exertion and leg swelling (better in the morning, chronic). Negative for orthopnea, palpitations, paroxysmal nocturnal dyspnea and syncope.  ?Respiratory:  Positive for shortness of breath.   ? ?PHYSICAL EXAM: ? ?  07/22/2021  ? 10:40 AM 11/21/2017  ? 10:26 PM 11/21/2017  ?  7:34 PM  ?Vitals with BMI  ?Height 5\' 3"   5\' 3"   ?Weight 224 lbs  210 lbs  ?BMI 39.69  37.21  ?Systolic 123 162   ?Diastolic 83 77   ?Pulse 77 70   ? ? ?CONSTITUTIONAL: Well-developed and well-nourished. No acute distress.  ?SKIN: Skin is warm and dry. No rash noted. No cyanosis. No pallor. No jaundice ?HEAD: Normocephalic and atraumatic.  ?EYES: No scleral icterus ?MOUTH/THROAT: Moist oral membranes.  ?NECK: No JVD present. No thyromegaly noted. No carotid bruits  ?LYMPHATIC: No visible cervical adenopathy.  ?CHEST Normal respiratory effort. No intercostal retractions  ?LUNGS: Clear to auscultation bilaterally.  No stridor. No wheezes. No rales.  ?CARDIOVASCULAR: Regular rate and rhythm, positive S1-S2,  no murmurs rubs or gallops appreciated. ?ABDOMINAL: Soft, nontender, nondistended, positive bowel sounds in all 4 quadrants, obese, no apparent ascites.  ?EXTREMITIES: No peripheral edema, warm to touch, 2+ bilateral DP and PT pulses ?HEMATOLOGIC: No significant bruising ?NEUROLOGIC: Oriented to person, place, and time. Nonfocal. Normal muscle tone.  ?PSYCHIATRIC: Normal mood and affect. Normal behavior. Cooperative ? ?CARDIAC DATABASE: ?EKG: ?07/22/2021: Normal sinus rhythm, 70 bpm, left axis, left anterior fascicular block. ? ?Echocardiogram: ?March 02, 2021 available in Care Everywhere. ?LVEF 60 to 65%, grade 1A diastolic dysfunction with normal left atrial pressure, no significant valvular heart disease. ? ?Stress Testing: ?Exercise treadmill stress test [facility] available in Care Everywhere: ?November 28, 2020 per report: ?1.  Exercise capacity was fair for age exercised only 5 minutes and 18 seconds of the Bruce protocol

## 2021-07-23 LAB — CMP14+EGFR
ALT: 23 IU/L (ref 0–32)
AST: 24 IU/L (ref 0–40)
Albumin/Globulin Ratio: 1.7 (ref 1.2–2.2)
Albumin: 4.6 g/dL (ref 3.8–4.8)
Alkaline Phosphatase: 74 IU/L (ref 44–121)
BUN/Creatinine Ratio: 20 (ref 12–28)
BUN: 15 mg/dL (ref 8–27)
Bilirubin Total: 0.4 mg/dL (ref 0.0–1.2)
CO2: 21 mmol/L (ref 20–29)
Calcium: 9.4 mg/dL (ref 8.7–10.3)
Chloride: 105 mmol/L (ref 96–106)
Creatinine, Ser: 0.74 mg/dL (ref 0.57–1.00)
Globulin, Total: 2.7 g/dL (ref 1.5–4.5)
Glucose: 78 mg/dL (ref 70–99)
Potassium: 4.4 mmol/L (ref 3.5–5.2)
Sodium: 140 mmol/L (ref 134–144)
Total Protein: 7.3 g/dL (ref 6.0–8.5)
eGFR: 90 mL/min/{1.73_m2} (ref >59)

## 2021-07-23 LAB — MICROALBUMIN / CREATININE URINE RATIO
Creatinine, Urine: 107.4 mg/dL
Microalb/Creat Ratio: 5 mg/g creat (ref 0–29)
Microalbumin, Urine: 5.4 ug/mL

## 2021-07-23 LAB — PRO B NATRIURETIC PEPTIDE: NT-Pro BNP: 74 pg/mL (ref 0–287)

## 2021-07-25 DIAGNOSIS — Z86010 Personal history of colonic polyps: Secondary | ICD-10-CM | POA: Diagnosis not present

## 2021-07-25 DIAGNOSIS — K219 Gastro-esophageal reflux disease without esophagitis: Secondary | ICD-10-CM | POA: Diagnosis not present

## 2021-07-25 DIAGNOSIS — K5730 Diverticulosis of large intestine without perforation or abscess without bleeding: Secondary | ICD-10-CM | POA: Diagnosis not present

## 2021-07-25 DIAGNOSIS — R0789 Other chest pain: Secondary | ICD-10-CM | POA: Diagnosis not present

## 2021-07-25 DIAGNOSIS — Z1211 Encounter for screening for malignant neoplasm of colon: Secondary | ICD-10-CM | POA: Diagnosis not present

## 2021-08-06 DIAGNOSIS — N390 Urinary tract infection, site not specified: Secondary | ICD-10-CM | POA: Diagnosis not present

## 2021-08-16 DIAGNOSIS — J029 Acute pharyngitis, unspecified: Secondary | ICD-10-CM | POA: Diagnosis not present

## 2021-08-16 DIAGNOSIS — R300 Dysuria: Secondary | ICD-10-CM | POA: Diagnosis not present

## 2021-08-16 DIAGNOSIS — N3000 Acute cystitis without hematuria: Secondary | ICD-10-CM | POA: Diagnosis not present

## 2021-08-16 DIAGNOSIS — N898 Other specified noninflammatory disorders of vagina: Secondary | ICD-10-CM | POA: Diagnosis not present

## 2021-08-16 DIAGNOSIS — M545 Low back pain, unspecified: Secondary | ICD-10-CM | POA: Diagnosis not present

## 2021-08-16 DIAGNOSIS — E7800 Pure hypercholesterolemia, unspecified: Secondary | ICD-10-CM | POA: Diagnosis not present

## 2021-08-19 ENCOUNTER — Ambulatory Visit: Payer: 59 | Admitting: Cardiology

## 2021-08-28 ENCOUNTER — Ambulatory Visit
Admission: RE | Admit: 2021-08-28 | Discharge: 2021-08-28 | Disposition: A | Discharge status: Home / Self Care | Payer: No Typology Code available for payment source | Source: Ambulatory Visit | Attending: Cardiology | Admitting: Cardiology

## 2021-08-28 DIAGNOSIS — R072 Precordial pain: Secondary | ICD-10-CM

## 2021-09-02 DIAGNOSIS — H2513 Age-related nuclear cataract, bilateral: Secondary | ICD-10-CM | POA: Diagnosis not present

## 2021-09-02 DIAGNOSIS — G35 Multiple sclerosis: Secondary | ICD-10-CM | POA: Diagnosis not present

## 2021-09-02 DIAGNOSIS — H16223 Keratoconjunctivitis sicca, not specified as Sjogren's, bilateral: Secondary | ICD-10-CM | POA: Diagnosis not present

## 2021-09-05 ENCOUNTER — Ambulatory Visit: Payer: 59 | Admitting: Cardiology

## 2021-09-05 ENCOUNTER — Encounter: Payer: Self-pay | Admitting: Cardiology

## 2021-09-05 VITALS — BP 144/83 | HR 67 | Temp 98.0°F | Resp 16 | Ht 63.0 in | Wt 223.0 lb

## 2021-09-05 DIAGNOSIS — I10 Essential (primary) hypertension: Secondary | ICD-10-CM

## 2021-09-05 DIAGNOSIS — I2510 Atherosclerotic heart disease of native coronary artery without angina pectoris: Secondary | ICD-10-CM | POA: Diagnosis not present

## 2021-09-05 DIAGNOSIS — E7801 Familial hypercholesterolemia: Secondary | ICD-10-CM | POA: Diagnosis not present

## 2021-09-05 DIAGNOSIS — Z789 Other specified health status: Secondary | ICD-10-CM | POA: Diagnosis not present

## 2021-09-05 DIAGNOSIS — Z87891 Personal history of nicotine dependence: Secondary | ICD-10-CM

## 2021-09-05 DIAGNOSIS — Z6839 Body mass index (BMI) 39.0-39.9, adult: Secondary | ICD-10-CM | POA: Diagnosis not present

## 2021-09-05 DIAGNOSIS — R0602 Shortness of breath: Secondary | ICD-10-CM

## 2021-09-05 DIAGNOSIS — R931 Abnormal findings on diagnostic imaging of heart and coronary circulation: Secondary | ICD-10-CM

## 2021-09-05 DIAGNOSIS — E7800 Pure hypercholesterolemia, unspecified: Secondary | ICD-10-CM

## 2021-09-05 MED ORDER — LOSARTAN POTASSIUM-HCTZ 50-12.5 MG PO TABS: 1.0000 | ORAL_TABLET | Freq: Every morning | ORAL | 0 refills | Status: DC

## 2021-09-05 MED ORDER — PRALUENT 150 MG/ML ~~LOC~~ SOAJ: 150.0000 mg | SUBCUTANEOUS | 3 refills | Status: AC

## 2021-09-05 MED ORDER — ASPIRIN 81 MG PO TBEC: 81.0000 mg | DELAYED_RELEASE_TABLET | Freq: Every day | ORAL | 12 refills | Status: AC

## 2021-09-05 NOTE — Progress Notes (Signed)
ID:  Sheryl Turner, DOB 1955/03/29, MRN [id number]  PCP:  Roderick Pee, PA  Cardiologist:  Tessa Lerner, DO, [facility] (established care 07/22/2021) Former Cardiology Providers: Dr. Seward Speck [facility])  Date: 09/05/21 Last Office Visit: 07/22/2021  Chief Complaint  Patient presents with   Follow-up    Reevaluation of shortness of breath and discuss test results    HPI  Sheryl Turner is a 66 y.o. Caucasian female whose past medical history and cardiovascular risk factors include: Moderate CAC (100AU, 83th percentile), statin intolerance, prediabetic, pure hypercholesterolemia, former smoker, hypertension, frequent PVCs, obesity due to excess calories, postmenopausal female.  Referred to the practice for evaluation of shortness of breath.  Symptoms have been present since September 2022 which could exacerbated with effort related activities, improved with resting, has lower extremity swelling which improves by the time she wakes up in the morning.  She was evaluated by another cardiologist underwent GXT and echocardiogram.  Due to continued symptoms recommended left heart catheterization to evaluate for CAD.  However, patient did not want to undergo left heart catheterization without additional noninvasive work-up and therefore was referred to our practice for second opinion.  Since last office visit patient underwent coronary calcium which notes moderate CAC and labs which illustrated stable renal function and NT proBNP.  She denies anginal discomfort.  Patient has a history of pure hypercholesterolemia and likely familial.  She was on Repatha which helped her lipids significantly but due to insurance coverage is no longer on it.  She tried to be on Praluent but this was refused by her insurance company according to her.  She has statin intolerance.  She has not establish care with pulmonary medicine for evaluation of shortness of breath.  She is a former smoker who quit in  1999.  FUNCTIONAL STATUS: No structured exercise program or daily routine.   ALLERGIES: Allergies  Allergen Reactions   Darvocet [Propoxyphene N-Acetaminophen] Nausea And Vomiting   Penicillins Other (See Comments)    Orally = mouth ulcers Has patient had a PCN reaction causing immediate rash, facial/tongue/throat swelling, SOB or lightheadedness with hypotension: Yes Has patient had a PCN reaction causing severe rash involving mucus membranes or skin necrosis: No Has patient had a PCN reaction that required hospitalization: No Has patient had a PCN reaction occurring within the last 10 years: Yes If all of the above answers are "NO", then may proceed with Cephalosporin use.     Lipitor [Atorvastatin] Other (See Comments)    Causes muscle soreness   Adhesive [Tape] Other (See Comments)    Skin irritation     MEDICATION LIST PRIOR TO VISIT: Current Meds  Medication Sig   Alirocumab (PRALUENT) 150 MG/ML SOAJ Inject 150 mg into the skin every 14 (fourteen) days for 6 doses.   amLODipine (NORVASC) 5 MG tablet Take 5 mg by mouth daily.   aspirin EC 81 MG tablet Take 1 tablet (81 mg total) by mouth daily. Swallow whole.   Cholecalciferol (VITAMIN D3 SUPER STRENGTH) 50 MCG (2000 UT) TABS Take 2,000 Units by mouth daily.   cyclobenzaprine (FLEXERIL) 10 MG tablet Take 10 mg by mouth 3 (three) times daily as needed for muscle spasms.    diclofenac (VOLTAREN) 75 MG EC tablet Take 1 tablet by mouth 2 (two) times daily.   ezetimibe (ZETIA) 10 MG tablet Take 1 tablet by mouth daily.   fluticasone (FLONASE) 50 MCG/ACT nasal spray Place 2 sprays into both nostrils daily as needed for allergies or rhinitis.  ketoconazole (NIZORAL) 2 % shampoo Apply topically.   levothyroxine (SYNTHROID) 112 MCG tablet Take 1 tablet by mouth daily.   losartan-hydrochlorothiazide (HYZAAR) 50-12.5 MG tablet Take 1 tablet by mouth every morning.   metoprolol succinate (TOPROL XL) 50 MG 24 hr tablet Take 1 tablet  (50 mg total) by mouth daily.   pantoprazole (PROTONIX) 40 MG tablet Take 40 mg by mouth daily before breakfast.   Probiotic Product (PROBIOTIC PO) Take 1 capsule by mouth daily.   Simethicone (GAS-X EXTRA STRENGTH PO) Gas-X   triamcinolone cream (KENALOG) 0.1 % Apply 1 application topically daily as needed (for itchiness on scalp).    Triprolidine-Pseudoephedrine (ANTIHISTAMINE PO) allegra     PAST MEDICAL HISTORY: Past Medical History:  Diagnosis Date   GERD (gastroesophageal reflux disease)    H/O hiatal hernia    Hyperlipidemia    Hyperthyroidism    Hypothyroidism    Jaundice 09/14/2012   noticed in eyes, /w obstruction    Pancreatitis    09-14-12 recent Hospital stay [facility]   Thyroid disease     PAST SURGICAL HISTORY: Past Surgical History:  Procedure Laterality Date   ABDOMINAL HYSTERECTOMY     APPENDECTOMY     CESAREAN SECTION     CHOLECYSTECTOMY N/A 11/26/2012   Procedure: LAPAROSCOPIC CHOLECYSTECTOMY WITH INTRAOPERATIVE CHOLANGIOGRAM;  Surgeon: Atilano Ina, MD;  Location: [facility] OR;  Service: General;  Laterality: N/A;   ERCP N/A 09/16/2012   Procedure: ENDOSCOPIC RETROGRADE CHOLANGIOPANCREATOGRAPHY (ERCP);  Surgeon: Petra Kuba, MD;  Location: [facility] OR;  Service: Endoscopy;  Laterality: N/A;   ERCP N/A 10/20/2012   Procedure: ENDOSCOPIC RETROGRADE CHOLANGIOPANCREATOGRAPHY (ERCP);  Surgeon: Petra Kuba, MD;  Location: [facility];  Service: Endoscopy;  Laterality: N/A;   EUS Left 09/15/2012   Procedure: UPPER ENDOSCOPIC ULTRASOUND (EUS) RADIAL, possible LINEAR to follow, possible side-viewing endoscope;  Surgeon: Willis Modena, MD;  Location: [facility] ENDOSCOPY;  Service: Endoscopy;  Laterality: Left;   SPYGLASS CHOLANGIOSCOPY N/A 10/20/2012   Procedure: DXIPJASN CHOLANGIOSCOPY;  Surgeon: Petra Kuba, MD;  Location: [facility] ENDOSCOPY;  Service: Endoscopy;  Laterality: N/A;   TUBAL LIGATION      FAMILY HISTORY: The patient family history includes Cerebral aneurysm in her mother; Heart  failure in her brother.  SOCIAL HISTORY:  The patient  reports that she quit smoking about 22 years ago. Her smoking use included cigarettes. She has a 22.00 pack-year smoking history. She has never used smokeless tobacco. She reports that she does not drink alcohol and does not use drugs.  REVIEW OF SYSTEMS: Review of Systems  Cardiovascular:  Positive for dyspnea on exertion and leg swelling (better in the morning, chronic). Negative for chest pain, orthopnea, palpitations, paroxysmal nocturnal dyspnea and syncope.  Respiratory:  Positive for shortness of breath.     PHYSICAL EXAM:    09/05/2021    3:16 PM 07/22/2021   10:40 AM 11/21/2017   10:26 PM  Vitals with BMI  Height 5\' 3"  5\' 3"    Weight 223 lbs 224 lbs   BMI 39.51 39.69   Systolic 144 123  Diastolic 83 83 77  Pulse 67 77 70    CONSTITUTIONAL: Well-developed and well-nourished. No acute distress.  SKIN: Skin is warm and dry. No rash noted. No cyanosis. No pallor. No jaundice HEAD: Normocephalic and atraumatic.  EYES: No scleral icterus MOUTH/THROAT: Moist oral membranes.  NECK: No JVD present. No thyromegaly noted. No carotid bruits  LYMPHATIC: No visible cervical adenopathy.  CHEST Normal respiratory effort. No intercostal  retractions  LUNGS: Clear to auscultation bilaterally.  No stridor. No wheezes. No rales.  CARDIOVASCULAR: Regular rate and rhythm, positive S1-S2, no murmurs rubs or gallops appreciated. ABDOMINAL: Soft, nontender, nondistended, positive bowel sounds in all 4 quadrants, obese, no apparent ascites.  EXTREMITIES: No peripheral edema, warm to touch, 2+ bilateral DP and PT pulses HEMATOLOGIC: No significant bruising NEUROLOGIC: Oriented to person, place, and time. Nonfocal. Normal muscle tone.  PSYCHIATRIC: Normal mood and affect. Normal behavior. Cooperative  CARDIAC DATABASE: EKG: 07/22/2021: Normal sinus rhythm, 70 bpm, left axis, left anterior fascicular block.  Echocardiogram: March 02, 2021 available in [place]. LVEF 60 to 65%, grade 1A diastolic dysfunction with normal left atrial pressure, no significant valvular heart disease.  Stress Testing: Exercise treadmill stress test [facility] available in Care Everywhere: November 28, 2020 per report: 1.  Exercise capacity was fair for age exercised only 5 minutes and 18 seconds of the Bruce protocol achieving 7 METS  2.  The stress test was negative for ischemia with no diagnostic EKG changes at target heart rate  3.  Resting hypertension with with an exaggerated response to exercise.   Heart Catheterization: None  Coronary artery calcium score 08/28/2021 1. Three-vessel coronary artery calcification.   2. Total Agatston Score: 100   3. MESA age and sex matched database percentile: 94th  LABORATORY DATA:    Latest Ref Rng & Units 03/10/2017    2:35 PM 11/18/2012    2:50 PM 09/18/2012    4:08 AM  CBC  WBC 4.0 - 10.5 K/uL 6.9  6.7  7.6   Hemoglobin 12.0 - 15.0 g/dL 13.8  14.0  12.4   Hematocrit 36.0 - 46.0 % 42.4  41.4  36.5   Platelets 150 - 400 K/uL 279  234  130        Latest Ref Rng & Units 07/22/2021   12:55 PM 03/10/2017    5:16 PM 03/10/2017    2:35 PM  CMP  Glucose 70 - 99 mg/dL 78   104   BUN 8 - 27 mg/dL 15   16   Creatinine 0.57 - 1.00 mg/dL 0.74   0.98   Sodium 134 - 144 mmol/L 140   142   Potassium 3.5 - 5.2 mmol/L 4.4   4.2   Chloride 96 - 106 mmol/L 105   108   CO2 20 - 29 mmol/L 21   25   Calcium 8.7 - 10.3 mg/dL 9.4   9.4   Total Protein 6.0 - 8.5 g/dL 7.3  6.7    Total Bilirubin 0.0 - 1.2 mg/dL 0.4  0.6    Alkaline Phos 44 - 121 IU/L 74  55    AST 0 - 40 IU/L 24  26    ALT 0 - 32 IU/L 23  23      Lipid Panel  No results found for: "CHOL", "TRIG", "HDL", "CHOLHDL", "VLDL", "LDLCALC", "LDLDIRECT", "LABVLDL"  No components found for: "NTPROBNP" Recent Labs    07/22/21 1255  PROBNP 74   No results for input(s): "TSH" in the last 8760  hours.  BMP Recent Labs    07/22/21 1255  NA 140  K 4.4  CL 105  CO2 21  GLUCOSE 78  BUN 15  CREATININE 0.74  CALCIUM 9.4    HEMOGLOBIN A1C No results found for: "HGBA1C", "MPG"  External Labs: Collected: 06/03/2021 Total cholesterol 256, triglycerides 259, HDL 46, LDL direct 175, non-HDL 210 TSH 5.27  IMPRESSION:    ICD-10-CM   1. Exertional shortness of breath  R06.02 CT CORONARY MORPH W/CTA COR W/SCORE W/CA W/CM &/OR WO/CM    Basic metabolic panel    losartan-hydrochlorothiazide (HYZAAR) 50-12.5 MG tablet    2. Coronary atherosclerosis due to calcified coronary lesion  I25.10 Alirocumab (PRALUENT) 150 MG/ML SOAJ   I25.84 CT CORONARY MORPH W/CTA COR W/SCORE W/CA W/CM &/OR WO/CM    aspirin EC 81 MG tablet    3. Agatston coronary artery calcium score between 100 and 199  R93.1 Alirocumab (PRALUENT) 150 MG/ML SOAJ    CT CORONARY MORPH W/CTA COR W/SCORE W/CA W/CM &/OR WO/CM    aspirin EC 81 MG tablet    4. Familial hypercholesteremia  E78.01 Alirocumab (PRALUENT) 150 MG/ML SOAJ    5. Pure hypercholesterolemia  E78.00 Alirocumab (PRALUENT) 150 MG/ML SOAJ    6. Statin intolerance  Z78.9 Alirocumab (PRALUENT) 150 MG/ML SOAJ    7. Benign hypertension  I10     8. Former smoker  Z87.891     33. Class 2 severe obesity due to excess calories with serious comorbidity and body mass index (BMI) of 39.0 to 39.9 in adult [facility])  E66.01    Z68.39        RECOMMENDATIONS: Sheryl Turner is a 66 y.o. Caucasian female whose past medical history and cardiac risk factors include:  Moderate CAC (100AU, 83th percentile), statin intolerance, prediabetic, pure hypercholesterolemia, former smoker, hypertension, frequent PVCs, obesity due to excess calories, postmenopausal female.  Exertional shortness of breath Chronic.  No change in intensity, frequency, and duration since last office visit. Her dyspnea on exertion usually improves with resting and given other cardiovascular risk  factors concerning for anginal equivalents.  Echo performed at outside facility reports preserved LVEF, grade 1 diastolic impairment.  No significant valvular heart disease.  GXT performed at outside facility was reported to be nonischemic. NT proBNP within normal limits. I have asked her to keep a log of her blood pressures and to bring it in at the next office visit. We discussed undergoing stress testing versus coronary CTA after discussing the risks, benefits, limitations patient would like to proceed with coronary CTA.  Coronary atherosclerosis due to calcified coronary lesion, total CAC 100AU, 83rd percentile Patient is intolerant to statin therapy. Needs to be on Repatha but states that Praluent is on her insurance formulary.  Prescription for Praluent sent. See above  Familial hypercholesteremia / Pure hypercholesterolemia /Statin intolerance Based on EMR has tried pravastatin, Crestor, atorvastatin all of which caused her myalgias. She responded to [place] well but no longer covered by insurance, according to patient.  We will prescribe Praluent.  Total CAC 100, placing her at the 83rd percentile, last direct LDL 175 mg/dL Continue Zetia  Benign hypertension Discontinue amlodipine secondary to suboptimal blood pressures and lower extremity swelling.  Start losartan/hydrochlorothiazide Labs in 1 week to evaluate kidney function and electrolytes Patient is asked to keep a log of blood pressures and to review it with either PCP or myself  Former smoker Educated the importance of continued smoking cessation, she quit in 1999.  Class 2 severe obesity due to excess calories with serious comorbidity and body mass index (BMI) of 39.0 to 39.9 in adult [facility]) Body mass index is 39.5 kg/m. I reviewed with the patient the importance of diet, regular physical activity/exercise, weight loss.   Patient is educated on increasing physical activity gradually as tolerated.  With the goal of  moderate intensity exercise for 30 minutes a day  5 days a week.  FINAL MEDICATION LIST END OF ENCOUNTER: Meds ordered this encounter  Medications   Alirocumab (PRALUENT) 150 MG/ML SOAJ    Sig: Inject 150 mg into the skin every 14 (fourteen) days for 6 doses.    Dispense:  2 mL    Refill:  3   losartan-hydrochlorothiazide (HYZAAR) 50-12.5 MG tablet    Sig: Take 1 tablet by mouth every morning.    Dispense:  30 tablet    Refill:  0   aspirin EC 81 MG tablet    Sig: Take 1 tablet (81 mg total) by mouth daily. Swallow whole.    Dispense:  30 tablet    Refill:  12    There are no discontinued medications.    Current Outpatient Medications:    Alirocumab (PRALUENT) 150 MG/ML SOAJ, Inject 150 mg into the skin every 14 (fourteen) days for 6 doses., Disp: 2 mL, Rfl: 3   amLODipine (NORVASC) 5 MG tablet, Take 5 mg by mouth daily., Disp: , Rfl:    aspirin EC 81 MG tablet, Take 1 tablet (81 mg total) by mouth daily. Swallow whole., Disp: 30 tablet, Rfl: 12   Cholecalciferol (VITAMIN D3 SUPER STRENGTH) 50 MCG (2000 UT) TABS, Take 2,000 Units by mouth daily., Disp: , Rfl:    cyclobenzaprine (FLEXERIL) 10 MG tablet, Take 10 mg by mouth 3 (three) times daily as needed for muscle spasms. , Disp: , Rfl:    diclofenac (VOLTAREN) 75 MG EC tablet, Take 1 tablet by mouth 2 (two) times daily., Disp: , Rfl:    ezetimibe (ZETIA) 10 MG tablet, Take 1 tablet by mouth daily., Disp: , Rfl:    fluticasone (FLONASE) 50 MCG/ACT nasal spray, Place 2 sprays into both nostrils daily as needed for allergies or rhinitis. , Disp: , Rfl:    ketoconazole (NIZORAL) 2 % shampoo, Apply topically., Disp: , Rfl:    levothyroxine (SYNTHROID) 112 MCG tablet, Take 1 tablet by mouth daily., Disp: , Rfl:    losartan-hydrochlorothiazide (HYZAAR) 50-12.5 MG tablet, Take 1 tablet by mouth every morning., Disp: 30 tablet, Rfl: 0   metoprolol succinate (TOPROL XL) 50 MG 24 hr tablet, Take 1 tablet (50 mg total) by mouth daily., Disp: 90  tablet, Rfl: 0   pantoprazole (PROTONIX) 40 MG tablet, Take 40 mg by mouth daily before breakfast., Disp: , Rfl:    Probiotic Product (PROBIOTIC PO), Take 1 capsule by mouth daily., Disp: , Rfl:    Simethicone (GAS-X EXTRA STRENGTH PO), Gas-X, Disp: , Rfl:    triamcinolone cream (KENALOG) 0.1 %, Apply 1 application topically daily as needed (for itchiness on scalp). , Disp: , Rfl:    Triprolidine-Pseudoephedrine (ANTIHISTAMINE PO), allegra, Disp: , Rfl:   Orders Placed This Encounter  Procedures   CT CORONARY MORPH W/CTA COR W/SCORE W/CA W/CM &/OR WO/CM   Basic metabolic panel    There are no Patient Instructions on file for this visit.   --Continue cardiac medications as reconciled in final medication list. --Return in about 4 weeks (around 10/03/2021) for Follow up, Dyspnea, Review test results. Or sooner if needed. --Continue follow-up with your primary care physician regarding the management of your other chronic comorbid conditions.  Patient's questions and concerns were addressed to her satisfaction. She voices understanding of the instructions provided during this encounter.   This note was created using a voice recognition software as a result there may be grammatical errors inadvertently enclosed that do not reflect the nature of this encounter. Every  attempt is made to correct such errors.  Rex Kras, [place], [facility]  Pager: [phone] Office: [phone]

## 2021-09-09 ENCOUNTER — Telehealth: Payer: Self-pay | Admitting: Cardiology

## 2021-09-10 DIAGNOSIS — N959 Unspecified menopausal and perimenopausal disorder: Secondary | ICD-10-CM | POA: Diagnosis not present

## 2021-09-17 LAB — BASIC METABOLIC PANEL
BUN/Creatinine Ratio: 19 (ref 12–28)
BUN: 14 mg/dL (ref 8–27)
CO2: 22 mmol/L (ref 20–29)
Calcium: 10.1 mg/dL (ref 8.7–10.3)
Chloride: 101 mmol/L (ref 96–106)
Creatinine, Ser: 0.73 mg/dL (ref 0.57–1.00)
Glucose: 118 mg/dL — ABNORMAL HIGH (ref 70–99)
Potassium: 4.3 mmol/L (ref 3.5–5.2)
Sodium: 139 mmol/L (ref 134–144)
eGFR: 92 mL/min/{1.73_m2} (ref >59)

## 2021-10-04 ENCOUNTER — Ambulatory Visit: Payer: 59 | Admitting: Cardiology

## 2021-10-25 ENCOUNTER — Other Ambulatory Visit: Payer: Self-pay

## 2021-10-25 DIAGNOSIS — I2510 Atherosclerotic heart disease of native coronary artery without angina pectoris: Secondary | ICD-10-CM

## 2021-11-02 LAB — BASIC METABOLIC PANEL
BUN/Creatinine Ratio: 23 (ref 12–28)
BUN: 14 mg/dL (ref 8–27)
CO2: 20 mmol/L (ref 20–29)
Calcium: 10 mg/dL (ref 8.7–10.3)
Chloride: 104 mmol/L (ref 96–106)
Creatinine, Ser: 0.6 mg/dL (ref 0.57–1.00)
Glucose: 108 mg/dL — ABNORMAL HIGH (ref 70–99)
Potassium: 4.1 mmol/L (ref 3.5–5.2)
Sodium: 144 mmol/L (ref 134–144)
eGFR: 100 mL/min/{1.73_m2} (ref >59)

## 2021-11-06 ENCOUNTER — Ambulatory Visit (HOSPITAL_COMMUNITY): Admission: RE | Admit: 2021-11-06 | Payer: 59 | Source: Ambulatory Visit

## 2021-11-12 ENCOUNTER — Telehealth (HOSPITAL_COMMUNITY): Payer: Self-pay | Admitting: Emergency Medicine

## 2021-11-12 NOTE — Telephone Encounter (Signed)
Reaching out to patient to offer assistance regarding upcoming cardiac imaging study; pt verbalizes understanding of appt date/time, parking situation and where to check in, pre-test NPO status and medications ordered, and verified current allergies; name and call back number provided for further questions should they arise Rockwell Alexandria RN Navigator Cardiac Imaging [facility] [phone] office [phone] cell  Arrival 1000 w/c entrance Denies iv issues Holding hyzaar

## 2021-11-12 NOTE — Telephone Encounter (Signed)
Attempted to call patient regarding upcoming cardiac CT appointment. °Left message on voicemail with name and callback number °Brendaliz Kuk RN Navigator Cardiac Imaging °[facility] °[phone] Office °[phone] Cell ° °

## 2021-11-13 ENCOUNTER — Ambulatory Visit (HOSPITAL_COMMUNITY)
Admission: RE | Admit: 2021-11-13 | Discharge: 2021-11-13 | Disposition: A | Discharge status: Home / Self Care | Payer: 59 | Source: Ambulatory Visit | Attending: Cardiology | Admitting: Cardiology

## 2021-11-13 DIAGNOSIS — I2584 Coronary atherosclerosis due to calcified coronary lesion: Secondary | ICD-10-CM | POA: Insufficient documentation

## 2021-11-13 DIAGNOSIS — I2510 Atherosclerotic heart disease of native coronary artery without angina pectoris: Secondary | ICD-10-CM | POA: Insufficient documentation

## 2021-11-13 DIAGNOSIS — R0602 Shortness of breath: Secondary | ICD-10-CM | POA: Diagnosis present

## 2021-11-13 DIAGNOSIS — R931 Abnormal findings on diagnostic imaging of heart and coronary circulation: Secondary | ICD-10-CM | POA: Insufficient documentation

## 2021-11-13 MED ORDER — NITROGLYCERIN 0.4 MG SL SUBL
0.8000 mg | SUBLINGUAL_TABLET | Freq: Once | SUBLINGUAL | Status: AC
  Administered 2021-11-13: 0.8 mg via SUBLINGUAL

## 2021-11-13 MED ORDER — NITROGLYCERIN 0.4 MG SL SUBL
SUBLINGUAL_TABLET | SUBLINGUAL | Status: AC
  Filled 2021-11-13: qty 2

## 2021-11-13 MED ORDER — IOHEXOL 350 MG/ML SOLN
100.0000 mL | Freq: Once | INTRAVENOUS | Status: AC | PRN
  Administered 2021-11-13: 100 mL via INTRAVENOUS

## 2021-11-15 ENCOUNTER — Ambulatory Visit: Payer: 59 | Admitting: Cardiology

## 2021-12-02 ENCOUNTER — Ambulatory Visit: Payer: 59 | Admitting: Cardiology

## 2021-12-02 DIAGNOSIS — I2510 Atherosclerotic heart disease of native coronary artery without angina pectoris: Secondary | ICD-10-CM | POA: Insufficient documentation

## 2021-12-02 DIAGNOSIS — R0602 Shortness of breath: Secondary | ICD-10-CM | POA: Insufficient documentation

## 2021-12-02 DIAGNOSIS — R931 Abnormal findings on diagnostic imaging of heart and coronary circulation: Secondary | ICD-10-CM | POA: Insufficient documentation

## 2021-12-02 NOTE — Addendum Note (Signed)
Encounter addended by: Rex Kras, DO on: 12/02/2021 12:09 AM  Actions taken: Charge Capture section accepted, Problem List modified

## 2021-12-03 ENCOUNTER — Ambulatory Visit: Payer: 59 | Admitting: Cardiology

## 2021-12-03 ENCOUNTER — Encounter: Payer: Self-pay | Admitting: Cardiology

## 2021-12-03 VITALS — BP 145/77 | HR 71 | Temp 98.0°F | Resp 16 | Ht 63.0 in | Wt 227.0 lb

## 2021-12-03 DIAGNOSIS — Z87891 Personal history of nicotine dependence: Secondary | ICD-10-CM

## 2021-12-03 DIAGNOSIS — E7801 Familial hypercholesterolemia: Secondary | ICD-10-CM

## 2021-12-03 DIAGNOSIS — Z789 Other specified health status: Secondary | ICD-10-CM

## 2021-12-03 DIAGNOSIS — I10 Essential (primary) hypertension: Secondary | ICD-10-CM

## 2021-12-03 DIAGNOSIS — R0602 Shortness of breath: Secondary | ICD-10-CM

## 2021-12-03 DIAGNOSIS — I2510 Atherosclerotic heart disease of native coronary artery without angina pectoris: Secondary | ICD-10-CM

## 2021-12-03 DIAGNOSIS — E7800 Pure hypercholesterolemia, unspecified: Secondary | ICD-10-CM

## 2021-12-03 MED ORDER — PRALUENT 150 MG/ML ~~LOC~~ SOAJ: 150.0000 mg | SUBCUTANEOUS | 3 refills | Status: AC

## 2021-12-03 MED ORDER — AMLODIPINE BESY-BENAZEPRIL HCL 5-20 MG PO CAPS: 1.0000 | ORAL_CAPSULE | Freq: Every day | ORAL | 0 refills | Status: DC

## 2021-12-03 NOTE — Progress Notes (Signed)
ID:  Sheryl Turner, DOB Feb 02, 1956, MRN [id number]  PCP:  Roderick Pee, PA (Inactive)  Cardiologist:  Tessa Lerner, DO, [facility] (established care 07/22/2021) Former Cardiology Providers: Dr. Seward Speck [facility])  Date: 12/03/21 Last Office Visit: 09/05/2021  Chief Complaint  Patient presents with   Shortness of Breath   Follow-up   Results    CCTA    HPI  Sheryl Turner is a 66 y.o. Caucasian female whose past medical history and cardiovascular risk factors include: mild CAC (86.4AU, 81st percentile), statin intolerance, prediabetic, pure hypercholesterolemia, former smoker, hypertension, frequent PVCs, obesity due to excess calories, postmenopausal female.  Referred to the practice for evaluation of dyspnea and had undergone appropriate ischemic work-up however, due to progressive symptoms shared decision was to proceed with coronary CTA to evaluate for obstructive CAD.  Results of the coronary CTA reviewed with her including the images at today's office visit.  Since last office visit patient states that her shortness of breath is still present but improved compared to the past.  She has not been evaluated by pulmonary medicine in the past (former smoker, quit 1999).  She thinks it may be secondary to deconditioning/weight gain.  She does have history of pure hypercholesterolemia likely familial.  She has been intolerant to statin therapy in the past.  Had given a prescription for Praluent in the past but this is still not filled.  Home blood pressures are consistently less than 140 mmHg.  She has stopped taking her Protonix due to concerns of long-term use based on her research.  We discussed up titration of antihypertensive medications and possibly discontinuation of Toprol-XL.  However, patient states that she takes her Toprol-XL for palpitations would like to continue to do so otherwise her symptoms will resurface.  FUNCTIONAL STATUS: No structured exercise program or daily  routine.   ALLERGIES: Allergies  Allergen Reactions   Darvocet [Propoxyphene N-Acetaminophen] Nausea And Vomiting   Penicillins Other (See Comments)    Orally = mouth ulcers Has patient had a PCN reaction causing immediate rash, facial/tongue/throat swelling, SOB or lightheadedness with hypotension: Yes Has patient had a PCN reaction causing severe rash involving mucus membranes or skin necrosis: No Has patient had a PCN reaction that required hospitalization: No Has patient had a PCN reaction occurring within the last 10 years: Yes If all of the above answers are "NO", then may proceed with Cephalosporin use.     Lipitor [Atorvastatin] Other (See Comments)    Causes muscle soreness   Adhesive [Tape] Other (See Comments)    Skin irritation     MEDICATION LIST PRIOR TO VISIT: Current Meds  Medication Sig   Alirocumab (PRALUENT) 150 MG/ML SOAJ Inject 150 mg into the skin every 14 (fourteen) days for 6 days.   amLODipine-benazepril (LOTREL) 5-20 MG capsule Take 1 capsule by mouth daily.   aspirin EC 81 MG tablet Take 1 tablet (81 mg total) by mouth daily. Swallow whole.   Cholecalciferol (VITAMIN D3 SUPER STRENGTH) 50 MCG (2000 UT) TABS Take 2,000 Units by mouth daily.   cyclobenzaprine (FLEXERIL) 10 MG tablet Take 10 mg by mouth 3 (three) times daily as needed for muscle spasms.    diclofenac (VOLTAREN) 75 MG EC tablet Take 1 tablet by mouth 2 (two) times daily.   ezetimibe (ZETIA) 10 MG tablet Take 1 tablet by mouth daily.   fluticasone (FLONASE) 50 MCG/ACT nasal spray Place 2 sprays into both nostrils daily as needed for allergies or rhinitis.    ketoconazole (  NIZORAL) 2 % shampoo Apply topically.   levothyroxine (SYNTHROID) 112 MCG tablet Take 1 tablet by mouth daily.   metoprolol succinate (TOPROL XL) 50 MG 24 hr tablet Take 1 tablet (50 mg total) by mouth daily.   pantoprazole (PROTONIX) 40 MG tablet Take 40 mg by mouth daily before breakfast.   Probiotic Product (PROBIOTIC PO)  Take 1 capsule by mouth daily.   Simethicone (GAS-X EXTRA STRENGTH PO) Gas-X   triamcinolone cream (KENALOG) 0.1 % Apply 1 application topically daily as needed (for itchiness on scalp).    Triprolidine-Pseudoephedrine (ANTIHISTAMINE PO) allegra   [DISCONTINUED] amLODipine (NORVASC) 5 MG tablet Take 5 mg by mouth daily.     PAST MEDICAL HISTORY: Past Medical History:  Diagnosis Date   GERD (gastroesophageal reflux disease)    H/O hiatal hernia    Hyperlipidemia    Hyperthyroidism    Hypothyroidism    Jaundice 09/14/2012   noticed in eyes, /w obstruction    Pancreatitis    09-14-12 recent Hospital stay [facility]   Thyroid disease     PAST SURGICAL HISTORY: Past Surgical History:  Procedure Laterality Date   ABDOMINAL HYSTERECTOMY     APPENDECTOMY     CESAREAN SECTION     CHOLECYSTECTOMY N/A 11/26/2012   Procedure: LAPAROSCOPIC CHOLECYSTECTOMY WITH INTRAOPERATIVE CHOLANGIOGRAM;  Surgeon: Gayland Curry, MD;  Location: [place];  Service: General;  Laterality: N/A;   ERCP N/A 09/16/2012   Procedure: ENDOSCOPIC RETROGRADE CHOLANGIOPANCREATOGRAPHY (ERCP);  Surgeon: Jeryl Columbia, MD;  Location: [place];  Service: Endoscopy;  Laterality: N/A;   ERCP N/A 10/20/2012   Procedure: ENDOSCOPIC RETROGRADE CHOLANGIOPANCREATOGRAPHY (ERCP);  Surgeon: Jeryl Columbia, MD;  Location: [facility];  Service: Endoscopy;  Laterality: N/A;   EUS Left 09/15/2012   Procedure: UPPER ENDOSCOPIC ULTRASOUND (EUS) RADIAL, possible LINEAR to follow, possible side-viewing endoscope;  Surgeon: Arta Silence, MD;  Location: [facility];  Service: Endoscopy;  Laterality: Left;   SPYGLASS CHOLANGIOSCOPY N/A 10/20/2012   Procedure: GGYIRSWN CHOLANGIOSCOPY;  Surgeon: Jeryl Columbia, MD;  Location: [facility];  Service: Endoscopy;  Laterality: N/A;   TUBAL LIGATION      FAMILY HISTORY: The patient family history includes Cerebral aneurysm in her mother; Heart failure in her brother.  SOCIAL HISTORY:  The patient  reports that she  quit smoking about 23 years ago. Her smoking use included cigarettes. She has a 22.00 pack-year smoking history. She has never used smokeless tobacco. She reports that she does not drink alcohol and does not use drugs.  REVIEW OF SYSTEMS: Review of Systems  Cardiovascular:  Positive for dyspnea on exertion. Negative for chest pain, claudication, irregular heartbeat, leg swelling, near-syncope, orthopnea, palpitations, paroxysmal nocturnal dyspnea and syncope.  Respiratory:  Positive for shortness of breath.   Hematologic/Lymphatic: Negative for bleeding problem.  Musculoskeletal:  Negative for muscle cramps and myalgias.  Neurological:  Negative for dizziness and light-headedness.    PHYSICAL EXAM:    12/03/2021    1:10 PM 11/13/2021   10:43 AM 11/13/2021    9:45 AM  Vitals with BMI  Height 5\' 3"     Weight 227 lbs    BMI 46.27    Systolic 035 009 381  Diastolic 77 71 77  Pulse 71 68 59    CONSTITUTIONAL: Well-developed and well-nourished. No acute distress.  SKIN: Skin is warm and dry. No rash noted. No cyanosis. No pallor. No jaundice HEAD: Normocephalic and atraumatic.  EYES: No scleral icterus MOUTH/THROAT: Moist oral membranes.  NECK: No JVD present.  No thyromegaly noted. No carotid bruits  CHEST Normal respiratory effort. No intercostal retractions  LUNGS: Clear to auscultation bilaterally.  No stridor. No wheezes. No rales.  CARDIOVASCULAR: Regular rate and rhythm, positive S1-S2, no murmurs rubs or gallops appreciated. ABDOMINAL: Soft, nontender, nondistended, positive bowel sounds in all 4 quadrants, obese, no apparent ascites.  EXTREMITIES: No peripheral edema, warm to touch, 2+ bilateral DP and PT pulses HEMATOLOGIC: No significant bruising NEUROLOGIC: Oriented to person, place, and time. Nonfocal. Normal muscle tone.  PSYCHIATRIC: Normal mood and affect. Normal behavior. Cooperative  CARDIAC DATABASE: EKG: 07/22/2021: Normal sinus rhythm, 70 bpm, left axis, left  anterior fascicular block.  Echocardiogram: March 02, 2021 available in Care Everywhere. LVEF 60 to 65%, grade 1A diastolic dysfunction with normal left atrial pressure, no significant valvular heart disease.  Stress Testing: Exercise treadmill stress test [facility] available in Care Everywhere: November 28, 2020 per report: 1.  Exercise capacity was fair for age exercised only 5 minutes and 18 seconds of the Bruce protocol achieving 7 METS  2.  The stress test was negative for ischemia with no diagnostic EKG changes at target heart rate  3.  Resting hypertension with with an exaggerated response to exercise.   Heart Catheterization: None  CCTA 11/13/2021  1. Total coronary calcium score of 86.4. This was 81st percentile for age and sex matched control.   2. Normal coronary origin with right dominance.   3. CAD-RADS = 1 Minimal non-obstructive CAD.   Left Main: minimal stenosis (0-24%) at ostial left main due to calcified plaque.   LAD: Patent with minimal luminal irregularities due to calcified plaque.   LCX: Patent.   RCA: Patent with minimal luminal irregularities due to calcified plaque.   4.  Aortic atherosclerosis.  5. No significant extracardiac findings.   RECOMMENDATIONS: Consider non-atherosclerotic causes of chest pain. Consider preventive therapy and risk factor modification.  LABORATORY DATA:    Latest Ref Rng & Units 03/10/2017    2:35 PM 11/18/2012    2:50 PM 09/18/2012    4:08 AM  CBC  WBC 4.0 - 10.5 K/uL 6.9  6.7  7.6   Hemoglobin 12.0 - 15.0 g/dL 29.5  18.8  41.6   Hematocrit 36.0 - 46.0 % 42.4  41.4  36.5   Platelets 150 - 400 K/uL 279  234  130        Latest Ref Rng & Units 11/01/2021    2:57 PM 09/16/2021    4:05 PM 07/22/2021   12:55 PM  CMP  Glucose 70 - 99 mg/dL 606  301  78   BUN 8 - 27 mg/dL 14  14  15    Creatinine 0.57 - 1.00 mg/dL  6.01  0.93   Sodium 134 - 144 mmol/L 144  139  140    Potassium 3.5 - 5.2 mmol/L 4.1  4.3  4.4   Chloride 96 - 106 mmol/L 104  101  105   CO2 20 - 29 mmol/L 20  22  21    Calcium 8.7 - 10.3 mg/dL 2.35   9.4   Total Protein 6.0 - 8.5 g/dL   7.3   Total Bilirubin 0.0 - 1.2 mg/dL   0.4   Alkaline Phos 44 - 121 IU/L   74   AST 0 - 40 IU/L   24   ALT 0 - 32 IU/L   23     Lipid Panel  No results found for: "CHOL", "TRIG", "  HDL", "CHOLHDL", "VLDL", "LDLCALC", "LDLDIRECT", "LABVLDL"  No components found for: "NTPROBNP" Recent Labs    07/22/21 1255  PROBNP 74   No results for input(s): "TSH" in the last 8760 hours.  BMP Recent Labs    07/22/21 1255 09/16/21 1605 11/01/21 1457  NA 140 139 144  K 4.4 4.3 4.1  CL 105 101 104  CO2 GLUCOSE 78 118* 108*  BUN CREATININE 0.74 0.73 0.60  CALCIUM 9.4 10.1 10.0    HEMOGLOBIN A1C No results found for: "HGBA1C", "MPG"  External Labs: Collected: 06/03/2021 Total cholesterol 256, triglycerides 259, HDL 46, LDL direct 175, non-HDL 210 TSH 5.27   IMPRESSION:    ICD-10-CM   1. Coronary atherosclerosis due to calcified coronary lesion  I25.10    I25.84     2. Exertional shortness of breath  R06.02 amLODipine-benazepril (LOTREL) 5-20 MG capsule    3. Agatston coronary artery calcium score between 100 and 199  R93.1     4. Familial hypercholesteremia  E78.01 Alirocumab (PRALUENT) 150 MG/ML SOAJ    5. Pure hypercholesterolemia  E78.00 Alirocumab (PRALUENT) 150 MG/ML SOAJ    6. Statin intolerance  Z78.9 Alirocumab (PRALUENT) 150 MG/ML SOAJ    7. Benign hypertension  I10 amLODipine-benazepril (LOTREL) 5-20 MG capsule    8. Former smoker  Z87.891        RECOMMENDATIONS: Sheryl Turner is a 66 y.o. Caucasian female whose past medical history and cardiac risk factors include:  Moderate CAC (100AU, 83th percentile), statin intolerance, prediabetic, pure hypercholesterolemia, former smoker, hypertension, frequent PVCs, obesity due to excess calories,  postmenopausal female.  Coronary atherosclerosis due to calcified coronary lesion Total CAC 86.4, 81st percentile.  Coronary CTA illustrates very minimal nonobstructive CAD.  Results including images reviewed with the patient at today's office visit.  Reemphasized the importance of improving her modifiable cardiovascular risk factors. We will resend a prescription for Praluent given her lipid profile and comorbid conditions.  Exertional shortness of breath Ischemic work-up as outlined above. Chronic and stable. Likely secondary to either pulmonary etiology versus obesity/deconditioning. Patient's blood pressure can also be better controlled. We will discontinue amlodipine and start amlodipine/lisinopril. Monitor for now.  Familial hypercholesteremia / Pure hypercholesterolemia / Statin intolerance Patient states that she still has not received her Praluent. Prescription resent. Based on EMR she has tried pravastatin, Crestor, atorvastatin which have all caused myalgias. Most recent lipid profile illustrates a LDL levels of 175 mg/dL. Continue Zetia. Once she has been on Praluent for 3 doses she is asked to have a repeat lipid profile and CMP.. She is also asked to come in for nurse visit for medication administration education.  Benign hypertension Medication changes as discussed above. Reemphasized the importance of low-salt diet. Patient did not tolerate losartan/hydrochlorothiazide in the past.   FINAL MEDICATION LIST END OF ENCOUNTER: Meds ordered this encounter  Medications   Alirocumab (PRALUENT) 150 MG/ML SOAJ    Sig: Inject 150 mg into the skin every 14 (fourteen) days for 6 days.    Dispense:  2 mL    Refill:  3   amLODipine-benazepril (LOTREL) 5-20 MG capsule    Sig: Take 1 capsule by mouth daily.    Dispense:  30 capsule    Refill:  0    Medications Discontinued During This Encounter  Medication Reason   amLODipine (NORVASC) 5 MG tablet Change in therapy       Current Outpatient Medications:    Alirocumab (PRALUENT) 150 MG/ML  SOAJ, Inject 150 mg into the skin every 14 (fourteen) days for 6 days., Disp: 2 mL, Rfl: 3   amLODipine-benazepril (LOTREL) 5-20 MG capsule, Take 1 capsule by mouth daily., Disp: 30 capsule, Rfl: 0   aspirin EC 81 MG tablet, Take 1 tablet (81 mg total) by mouth daily. Swallow whole., Disp: 30 tablet, Rfl: 12   Cholecalciferol (VITAMIN D3 SUPER STRENGTH) 50 MCG (2000 UT) TABS, Take 2,000 Units by mouth daily., Disp: , Rfl:    cyclobenzaprine (FLEXERIL) 10 MG tablet, Take 10 mg by mouth 3 (three) times daily as needed for muscle spasms. , Disp: , Rfl:    diclofenac (VOLTAREN) 75 MG EC tablet, Take 1 tablet by mouth 2 (two) times daily., Disp: , Rfl:    ezetimibe (ZETIA) 10 MG tablet, Take 1 tablet by mouth daily., Disp: , Rfl:    fluticasone (FLONASE) 50 MCG/ACT nasal spray, Place 2 sprays into both nostrils daily as needed for allergies or rhinitis. , Disp: , Rfl:    ketoconazole (NIZORAL) 2 % shampoo, Apply topically., Disp: , Rfl:    levothyroxine (SYNTHROID) 112 MCG tablet, Take 1 tablet by mouth daily., Disp: , Rfl:    metoprolol succinate (TOPROL XL) 50 MG 24 hr tablet, Take 1 tablet (50 mg total) by mouth daily., Disp: 90 tablet, Rfl: 0   pantoprazole (PROTONIX) 40 MG tablet, Take 40 mg by mouth daily before breakfast., Disp: , Rfl:    Probiotic Product (PROBIOTIC PO), Take 1 capsule by mouth daily., Disp: , Rfl:    Simethicone (GAS-X EXTRA STRENGTH PO), Gas-X, Disp: , Rfl:    triamcinolone cream (KENALOG) 0.1 %, Apply 1 application topically daily as needed (for itchiness on scalp). , Disp: , Rfl:    Triprolidine-Pseudoephedrine (ANTIHISTAMINE PO), allegra, Disp: , Rfl:   No orders of the defined types were placed in this encounter.   There are no Patient Instructions on file for this visit.   --Continue cardiac medications as reconciled in final medication list. --Return in about 6 months (around 06/03/2022) for  Follow up, Coronary artery calcification, Lipid. Or sooner if needed. --Continue follow-up with your primary care physician regarding the management of your other chronic comorbid conditions.  Patient's questions and concerns were addressed to her satisfaction. She voices understanding of the instructions provided during this encounter.   This note was created using a voice recognition software as a result there may be grammatical errors inadvertently enclosed that do not reflect the nature of this encounter. Every attempt is made to correct such errors.  Tessa LernerSunit Evellyn Tuff, [place]DO, Northport Medical CenterFACC  Pager: [phone] Office: [phone]

## 2022-03-12 ENCOUNTER — Other Ambulatory Visit: Payer: Self-pay

## 2022-03-12 DIAGNOSIS — R0602 Shortness of breath: Secondary | ICD-10-CM

## 2022-03-12 DIAGNOSIS — I10 Essential (primary) hypertension: Secondary | ICD-10-CM

## 2022-03-12 MED ORDER — AMLODIPINE BESY-BENAZEPRIL HCL 5-20 MG PO CAPS: 1.0000 | ORAL_CAPSULE | Freq: Every day | ORAL | 0 refills | Status: DC

## 2022-03-12 MED ORDER — METOPROLOL SUCCINATE ER 50 MG PO TB24: 50.0000 mg | ORAL_TABLET | Freq: Every day | ORAL | 0 refills | Status: AC

## 2022-04-07 ENCOUNTER — Other Ambulatory Visit: Payer: Self-pay | Admitting: Cardiology

## 2022-04-07 DIAGNOSIS — R0602 Shortness of breath: Secondary | ICD-10-CM

## 2022-04-07 DIAGNOSIS — I10 Essential (primary) hypertension: Secondary | ICD-10-CM

## 2022-05-07 ENCOUNTER — Other Ambulatory Visit: Payer: Self-pay | Admitting: Cardiology

## 2022-05-07 DIAGNOSIS — R0602 Shortness of breath: Secondary | ICD-10-CM

## 2022-05-07 DIAGNOSIS — I10 Essential (primary) hypertension: Secondary | ICD-10-CM

## 2022-06-03 ENCOUNTER — Encounter: Payer: Self-pay | Admitting: Cardiology

## 2022-06-03 ENCOUNTER — Ambulatory Visit: Payer: Medicare Other | Admitting: Cardiology

## 2022-06-03 VITALS — BP 142/77 | HR 68 | Resp 17 | Ht 63.0 in | Wt 224.6 lb

## 2022-06-03 DIAGNOSIS — R0602 Shortness of breath: Secondary | ICD-10-CM

## 2022-06-03 DIAGNOSIS — E7800 Pure hypercholesterolemia, unspecified: Secondary | ICD-10-CM

## 2022-06-03 DIAGNOSIS — I2510 Atherosclerotic heart disease of native coronary artery without angina pectoris: Secondary | ICD-10-CM

## 2022-06-03 DIAGNOSIS — Z87891 Personal history of nicotine dependence: Secondary | ICD-10-CM

## 2022-06-03 DIAGNOSIS — Z789 Other specified health status: Secondary | ICD-10-CM

## 2022-06-03 DIAGNOSIS — E7801 Familial hypercholesterolemia: Secondary | ICD-10-CM

## 2022-06-03 NOTE — Progress Notes (Signed)
ID:  Sheryl Turner, DOB 26-May-1955, MRN [id number]  PCP:  Heywood Bene, PA-C  Cardiologist:  Rex Kras, DO, [facility] (established care 07/22/2021) Former Cardiology Providers: Dr. Blenda Bridegroom [facility])  Date: 06/03/22 Last Office Visit: 12/03/2021  Chief Complaint  Patient presents with   Hyperlipidemia   Follow-up    6 months- Coronary artery calcification    HPI  Sheryl Turner is a 67 y.o. Caucasian female whose past medical history and cardiovascular risk factors include: mild CAC (86.4AU, 81st percentile), statin intolerance, prediabetic, pure hypercholesterolemia, former smoker, hypertension, frequent PVCs, obesity due to excess calories, postmenopausal female.  Referred to the practice for evaluation of dyspnea and has undergone appropriate ischemic workup including a coronary CTA which notes mild CAC placing her at the 81st percentile and minimal nonobstructive CAD.  Given her history of hypercholesterolemia likely familial and intolerant to statin therapy in the past she was prescribed Praluent at last office visit.  Patient states that Praluent was not covered by [facility] but she also did not reach out for an alternative.  In the meantime she is now enrolled into [place] [facility] and her PCP is sending the prescription for Repatha.  She is awaiting to hear back.  Outside labs from March 2024 independently reviewed and noted below for further reference  In addition, given her dyspnea recommended that she follows up with pulmonary medicine given her history of former smoker and clinical suspicion for possible COPD and/or emphysema.  She was also educated on keeping track of her blood pressures at home to see if  additional medications are warranted.  At last visit her amlodipine was transitioned to Lotrel.  Patient states that home blood pressures are ranging between 127-130 mmHg.  Her shortness of breath has improved now only present with over  exertional activities.  She still has not establish care with pulmonary medicine.  She is lost approximately 3 pounds since last office visit.  Patient states that she has been a lot of emotional stress which has affected her follow-up.   FUNCTIONAL STATUS: No structured exercise program or daily routine.   ALLERGIES: Allergies  Allergen Reactions   Darvocet [Propoxyphene N-Acetaminophen] Nausea And Vomiting   Penicillins Other (See Comments)    Orally = mouth ulcers Has patient had a PCN reaction causing immediate rash, facial/tongue/throat swelling, SOB or lightheadedness with hypotension: Yes Has patient had a PCN reaction causing severe rash involving mucus membranes or skin necrosis: No Has patient had a PCN reaction that required hospitalization: No Has patient had a PCN reaction occurring within the last 10 years: Yes If all of the above answers are "NO", then may proceed with Cephalosporin use.     Lipitor [Atorvastatin] Other (See Comments)    Causes muscle soreness   Adhesive [Tape] Other (See Comments)    Skin irritation     MEDICATION LIST PRIOR TO VISIT: Current Meds  Medication Sig   amLODipine-benazepril (LOTREL) 5-20 MG capsule Take 1 capsule by mouth once daily   aspirin EC 81 MG tablet Take 1 tablet (81 mg total) by mouth daily. Swallow whole.   Cholecalciferol (VITAMIN D3 SUPER STRENGTH) 50 MCG (2000 UT) TABS Take 2,000 Units by mouth daily.   cyclobenzaprine (FLEXERIL) 10 MG tablet Take 10 mg by mouth 3 (three) times daily as needed for muscle spasms.    ezetimibe (ZETIA) 10 MG tablet Take 1 tablet by mouth daily.   fluticasone (FLONASE) 50 MCG/ACT nasal spray Place 2 sprays into both  nostrils daily as needed for allergies or rhinitis.    ketoconazole (NIZORAL) 2 % shampoo Apply topically.   levothyroxine (SYNTHROID) 112 MCG tablet Take 1 tablet by mouth daily.   metoprolol succinate (TOPROL XL) 50 MG 24 hr tablet Take 1 tablet (50 mg total) by mouth daily.    Probiotic Product (PROBIOTIC PO) Take 1 capsule by mouth daily.   Simethicone (GAS-X EXTRA STRENGTH PO) Gas-X   triamcinolone cream (KENALOG) 0.1 % Apply 1 application topically daily as needed (for itchiness on scalp).    Triprolidine-Pseudoephedrine (ANTIHISTAMINE PO) allegra     PAST MEDICAL HISTORY: Past Medical History:  Diagnosis Date   GERD (gastroesophageal reflux disease)    H/O hiatal hernia    Hyperlipidemia    Hyperthyroidism    Hypothyroidism    Jaundice 09/14/2012   noticed in eyes, /w obstruction    Pancreatitis    09-14-12 recent Hospital stay [facility]   Thyroid disease     PAST SURGICAL HISTORY: Past Surgical History:  Procedure Laterality Date   ABDOMINAL HYSTERECTOMY     APPENDECTOMY     CESAREAN SECTION     CHOLECYSTECTOMY N/A 11/26/2012   Procedure: LAPAROSCOPIC CHOLECYSTECTOMY WITH INTRAOPERATIVE CHOLANGIOGRAM;  Surgeon: Gayland Curry, MD;  Location: [facility];  Service: General;  Laterality: N/A;   ERCP N/A 09/16/2012   Procedure: ENDOSCOPIC RETROGRADE CHOLANGIOPANCREATOGRAPHY (ERCP);  Surgeon: Jeryl Columbia, MD;  Location: [facility];  Service: Endoscopy;  Laterality: N/A;   ERCP N/A 10/20/2012   Procedure: ENDOSCOPIC RETROGRADE CHOLANGIOPANCREATOGRAPHY (ERCP);  Surgeon: Jeryl Columbia, MD;  Location: [facility];  Service: Endoscopy;  Laterality: N/A;   EUS Left 09/15/2012   Procedure: UPPER ENDOSCOPIC ULTRASOUND (EUS) RADIAL, possible LINEAR to follow, possible side-viewing endoscope;  Surgeon: Arta Silence, MD;  Location: [facility];  Service: Endoscopy;  Laterality: Left;   SPYGLASS CHOLANGIOSCOPY N/A 10/20/2012   Procedure: [id number] CHOLANGIOSCOPY;  Surgeon: Jeryl Columbia, MD;  Location: [facility];  Service: Endoscopy;  Laterality: N/A;   TUBAL LIGATION      FAMILY HISTORY: The patient family history includes Cerebral aneurysm in her mother; Heart failure in her brother.  SOCIAL HISTORY:  The patient  reports that she quit smoking about 23 years ago. Her smoking  use included cigarettes. She has a 22.00 pack-year smoking history. She has never used smokeless tobacco. She reports that she does not drink alcohol and does not use drugs.  REVIEW OF SYSTEMS: Review of Systems  Cardiovascular:  Positive for dyspnea on exertion (improved). Negative for chest pain, claudication, irregular heartbeat, leg swelling, near-syncope, orthopnea, palpitations, paroxysmal nocturnal dyspnea and syncope.  Respiratory:  Positive for shortness of breath (improved).   Hematologic/Lymphatic: Negative for bleeding problem.  Musculoskeletal:  Negative for muscle cramps and myalgias.  Neurological:  Negative for dizziness and light-headedness.    PHYSICAL EXAM:    06/03/2022    1:45 PM 12/03/2021    1:10 PM 11/13/2021   10:43 AM  Vitals with BMI  Height 5\' 3"  5\' 3"    Weight 224 lbs 10 oz 227 lbs   BMI [id number]   Systolic [id number]  Diastolic 77 77 71  Pulse 68 71 68    Physical Exam  Constitutional: No distress.  Age appropriate, hemodynamically stable.   Neck: No JVD present.  Cardiovascular: Normal rate, regular rhythm, S1 normal, S2 normal, intact distal pulses and normal pulses. Exam reveals no gallop, no S3 and no S4.  No murmur heard. Pulses:  Dorsalis pedis pulses are 2+ on the right side and 2+ on the left side.       Posterior tibial pulses are 2+ on the right side and 2+ on the left side.  Pulmonary/Chest: Effort normal and breath sounds normal. No stridor. She has no wheezes. She has no rales.  Abdominal: Soft. Bowel sounds are normal. She exhibits no distension. There is no abdominal tenderness.  Musculoskeletal:        General: No edema.     Cervical back: Neck supple.  Neurological: She is alert and oriented to person, place, and time. She has intact cranial nerves (2-12).  Skin: Skin is warm and moist.   CARDIAC DATABASE: EKG: 06/03/2022: Sinus rhythm, 66 bpm, left axis, without underlying injury pattern.  Echocardiogram: March 02, 2021 available in [place]. LVEF 60 to 65%, grade 1A diastolic dysfunction with normal left atrial pressure, no significant valvular heart disease.  Stress Testing: Exercise treadmill stress test [facility] available in Care Everywhere: November 28, 2020 per report: 1.  Exercise capacity was fair for age exercised only 5 minutes and 18 seconds of the Bruce protocol achieving 7 METS  2.  The stress test was negative for ischemia with no diagnostic EKG changes at target heart rate  3.  Resting hypertension with with an exaggerated response to exercise.   Heart Catheterization: None  CCTA 11/13/2021  1. Total coronary calcium score of 86.4. This was 81st percentile for age and sex matched control.   2. Normal coronary origin with right dominance.   3. CAD-RADS = 1 Minimal non-obstructive CAD.   Left Main: minimal stenosis (0-24%) at ostial left main due to calcified plaque.   LAD: Patent with minimal luminal irregularities due to calcified plaque.   LCX: Patent.   RCA: Patent with minimal luminal irregularities due to calcified plaque.   4.  Aortic atherosclerosis.  5. No significant extracardiac findings.   RECOMMENDATIONS: Consider non-atherosclerotic causes of chest pain. Consider preventive therapy and risk factor modification.  LABORATORY DATA:    Latest Ref Rng & Units 03/10/2017    2:35 PM 11/18/2012    2:50 PM 09/18/2012    4:08 AM  CBC  WBC 4.0 - 10.5 K/uL 6.9  6.7  7.6   Hemoglobin 12.0 - 15.0 g/dL 13.8  14.0  12.4   Hematocrit 36.0 - 46.0 % 42.4  41.4  36.5   Platelets 150 - 400 K/uL 279  234  130        Latest Ref Rng & Units 11/01/2021    2:57 PM 09/16/2021    4:05 PM 07/22/2021   12:55 PM  CMP  Glucose 70 - 99 mg/dL 108  118  78   BUN 8 - 27 mg/dL 14  14  15    Creatinine 0.57 - 1.00 mg/dL 0.60  0.73  0.74   Sodium 134 - 144 mmol/L 144  139  140   Potassium 3.5 - 5.2 mmol/L 4.1  4.3  4.4   Chloride 96 - 106  mmol/L 104  101  105   CO2 20 - 29 mmol/L 20  22  21    Calcium 8.7 - 10.3 mg/dL 10.0  10.1  9.4   Total Protein 6.0 - 8.5 g/dL   7.3   Total Bilirubin 0.0 - 1.2 mg/dL   0.4   Alkaline Phos 44 - 121 IU/L   74   AST 0 - 40 IU/L   24  ALT 0 - 32 IU/L   23     Lipid Panel  No results found for: "CHOL", "TRIG", "HDL", "CHOLHDL", "VLDL", "LDLCALC", "LDLDIRECT", "LABVLDL"  No components found for: "NTPROBNP" Recent Labs    07/22/21 1255  PROBNP 74   No results for input(s): "TSH" in the last 8760 hours.  BMP Recent Labs    07/22/21 1255 09/16/21 1605 11/01/21 1457  NA 140 139 144  K 4.4 4.3 4.1  CL 105 101 104  CO2 21 22 20   GLUCOSE 78 118* 108*  BUN 15 14 14   CREATININE 0.74 0.73 0.60  CALCIUM 9.4 10.1 10.0    HEMOGLOBIN A1C No results found for: "HGBA1C", "MPG"  External Labs: Collected: 06/03/2021 Total cholesterol 256, triglycerides 259, HDL 46, LDL direct 175, non-HDL 210 TSH 5.27  Collected: 05/27/2022 Total cholesterol 250, triglycerides 273, HDL 48, calculated LDL 157, non-HDL 202 TSH 5.17 Sodium 136, potassium 4.5, chloride 107, bicarb 22. BUN 15, creatinine 0.69. AST 23, ALT 21, alkaline phosphatase 54 eGFR >90. Hemoglobin 14, hematocrit 42.3%   IMPRESSION:    ICD-10-CM   1. Coronary atherosclerosis due to calcified coronary lesion  I25.10 EKG 12-Lead   I25.84     2. Exertional shortness of breath  R06.02     3. Familial hypercholesteremia  E78.01     4. Pure hypercholesterolemia  E78.00     5. Statin intolerance  Z78.9     6. Former smoker  Z87.891     44. Class 2 severe obesity due to excess calories with serious comorbidity and body mass index (BMI) of 39.0 to 39.9 in adult [facility])  E66.01    Z68.39        RECOMMENDATIONS: Sheryl Turner is a 67 y.o. Caucasian female whose past medical history and cardiac risk factors include:  Moderate CAC (100AU, 83th percentile), statin intolerance, prediabetic, pure hypercholesterolemia, former smoker,  hypertension, frequent PVCs, obesity due to excess calories, postmenopausal female.  Coronary atherosclerosis due to calcified coronary lesion Total CAC 86.4, 81st percentile.  Coronary CTA illustrates very minimal nonobstructive CAD. Reemphasized importance of improving her modifiable cardiovascular risk factors. EKG: Nonischemic. Reemphasized importance of initiating Repatha or Praluent to help optimize her lipids.  Exertional shortness of breath Ischemic workup as outlined above. Overall improving since last office visit, per patient. Has not followed up with pulmonary medicine to evaluate for any emphysema/COPD given her smoking history.  Blood pressures have improved at home.  In the past did not tolerate losartan/HCTZ.  Monitor for now  Familial hypercholesteremia Pure hypercholesterolemia Statin intolerance In the past sent a prescription for Praluent-not filled due to insurance coverage.  The patient also did not reach out for an alternative. Since last office visit she is now enrolled into [facility] and PCP had sent a prescription for Repatha and is waiting for the medications. Most recent lipid profile from March 2024 notes uncontrolled hyperlipidemia/hypertriglyceridemia.  Continue Zetia for now. Have asked her to reach out if she has any issues getting Repatha filled.  Former smoker Reemphasized importance of continued smoking cessation.  Class 2 severe obesity due to excess calories with serious comorbidity and body mass index (BMI) of 39.0 to 39.9 in adult [facility]) Body mass index is 39.79 kg/m. I reviewed with her importance of diet, regular physical activity/exercise, weight loss.   Patient is educated on the importance of increasing physical activity gradually as tolerated with a goal of moderate intensity exercise for 30 minutes a day 5 days a week.  FINAL  MEDICATION LIST END OF ENCOUNTER: No orders of the defined types were placed in this encounter.    Medications Discontinued During This Encounter  Medication Reason   diclofenac (VOLTAREN) 75 MG EC tablet       Current Outpatient Medications:    amLODipine-benazepril (LOTREL) 5-20 MG capsule, Take 1 capsule by mouth once daily, Disp: 30 capsule, Rfl: 0   aspirin EC 81 MG tablet, Take 1 tablet (81 mg total) by mouth daily. Swallow whole., Disp: 30 tablet, Rfl: 12   Cholecalciferol (VITAMIN D3 SUPER STRENGTH) 50 MCG (2000 UT) TABS, Take 2,000 Units by mouth daily., Disp: , Rfl:    cyclobenzaprine (FLEXERIL) 10 MG tablet, Take 10 mg by mouth 3 (three) times daily as needed for muscle spasms. , Disp: , Rfl:    ezetimibe (ZETIA) 10 MG tablet, Take 1 tablet by mouth daily., Disp: , Rfl:    fluticasone (FLONASE) 50 MCG/ACT nasal spray, Place 2 sprays into both nostrils daily as needed for allergies or rhinitis. , Disp: , Rfl:    ketoconazole (NIZORAL) 2 % shampoo, Apply topically., Disp: , Rfl:    levothyroxine (SYNTHROID) 112 MCG tablet, Take 1 tablet by mouth daily., Disp: , Rfl:    metoprolol succinate (TOPROL XL) 50 MG 24 hr tablet, Take 1 tablet (50 mg total) by mouth daily., Disp: 90 tablet, Rfl: 0   Probiotic Product (PROBIOTIC PO), Take 1 capsule by mouth daily., Disp: , Rfl:    Simethicone (GAS-X EXTRA STRENGTH PO), Gas-X, Disp: , Rfl:    triamcinolone cream (KENALOG) 0.1 %, Apply 1 application topically daily as needed (for itchiness on scalp). , Disp: , Rfl:    Triprolidine-Pseudoephedrine (ANTIHISTAMINE PO), allegra, Disp: , Rfl:   Orders Placed This Encounter  Procedures   EKG 12-Lead     There are no Patient Instructions on file for this visit.   --Continue cardiac medications as reconciled in final medication list. --Return in about 6 months (around 12/04/2022) for Follow up, Coronary artery calcification, Lipid. Or sooner if needed. --Continue follow-up with your primary care physician regarding the management of your other chronic comorbid conditions.  Patient's  questions and concerns were addressed to her satisfaction. She voices understanding of the instructions provided during this encounter.   This note was created using a voice recognition software as a result there may be grammatical errors inadvertently enclosed that do not reflect the nature of this encounter. Every attempt is made to correct such errors.  Rex Kras, [place], [facility]  Pager:  [phone] Office: [phone]

## 2022-06-07 ENCOUNTER — Other Ambulatory Visit: Payer: Self-pay | Admitting: Cardiology

## 2022-06-07 DIAGNOSIS — R0602 Shortness of breath: Secondary | ICD-10-CM

## 2022-06-07 DIAGNOSIS — I10 Essential (primary) hypertension: Secondary | ICD-10-CM

## 2022-07-09 ENCOUNTER — Emergency Department (HOSPITAL_COMMUNITY): Payer: Medicare Other

## 2022-07-09 ENCOUNTER — Emergency Department (HOSPITAL_COMMUNITY)
Admission: EM | Admit: 2022-07-09 | Discharge: 2022-07-10 | Disposition: A | Discharge status: Home / Self Care | Payer: Medicare Other | Attending: Emergency Medicine | Admitting: Emergency Medicine

## 2022-07-09 ENCOUNTER — Encounter (HOSPITAL_COMMUNITY): Payer: Self-pay | Admitting: *Deleted

## 2022-07-09 ENCOUNTER — Other Ambulatory Visit: Payer: Self-pay

## 2022-07-09 DIAGNOSIS — R079 Chest pain, unspecified: Secondary | ICD-10-CM | POA: Diagnosis not present

## 2022-07-09 DIAGNOSIS — I10 Essential (primary) hypertension: Secondary | ICD-10-CM | POA: Diagnosis not present

## 2022-07-09 DIAGNOSIS — Z79899 Other long term (current) drug therapy: Secondary | ICD-10-CM | POA: Insufficient documentation

## 2022-07-09 DIAGNOSIS — R0602 Shortness of breath: Secondary | ICD-10-CM | POA: Diagnosis not present

## 2022-07-09 DIAGNOSIS — E039 Hypothyroidism, unspecified: Secondary | ICD-10-CM | POA: Diagnosis not present

## 2022-07-09 DIAGNOSIS — R531 Weakness: Secondary | ICD-10-CM | POA: Diagnosis not present

## 2022-07-09 DIAGNOSIS — N644 Mastodynia: Secondary | ICD-10-CM | POA: Insufficient documentation

## 2022-07-09 DIAGNOSIS — Z7982 Long term (current) use of aspirin: Secondary | ICD-10-CM | POA: Diagnosis not present

## 2022-07-09 LAB — BASIC METABOLIC PANEL
Anion gap: 13 (ref 5–15)
BUN: 18 mg/dL (ref 8–23)
CO2: 21 mmol/L — ABNORMAL LOW (ref 22–32)
Calcium: 9.6 mg/dL (ref 8.9–10.3)
Chloride: 103 mmol/L (ref 98–111)
Creatinine, Ser: 0.72 mg/dL (ref 0.44–1.00)
GFR, Estimated: >60 mL/min (ref >60)
Glucose, Bld: 96 mg/dL (ref 70–99)
Potassium: 4.4 mmol/L (ref 3.5–5.1)
Sodium: 137 mmol/L (ref 135–145)

## 2022-07-09 LAB — CBC WITH DIFFERENTIAL/PLATELET
Abs Immature Granulocytes: 0 10*3/uL (ref 0.00–0.07)
Basophils Absolute: 0.1 10*3/uL (ref 0.0–0.1)
Basophils Relative: 1 %
Eosinophils Absolute: 0.1 10*3/uL (ref 0.0–0.5)
Eosinophils Relative: 1 %
HCT: 43.6 % (ref 36.0–46.0)
Hemoglobin: 14.4 g/dL (ref 12.0–15.0)
Immature Granulocytes: 0 %
Lymphocytes Relative: 26 %
Lymphs Abs: 2 10*3/uL (ref 0.7–4.0)
MCH: 30.8 pg (ref 26.0–34.0)
MCHC: 33 g/dL (ref 30.0–36.0)
MCV: 93.4 fL (ref 80.0–100.0)
Monocytes Absolute: 0.4 10*3/uL (ref 0.1–1.0)
Monocytes Relative: 6 %
Neutro Abs: 5.1 10*3/uL (ref 1.7–7.7)
Neutrophils Relative %: 66 %
Platelets: 260 10*3/uL (ref 150–400)
RBC: 4.67 MIL/uL (ref 3.87–5.11)
RDW: 13.2 % (ref 11.5–15.5)
WBC: 7.7 10*3/uL (ref 4.0–10.5)
nRBC: 0 % (ref 0.0–0.2)

## 2022-07-09 LAB — TROPONIN I (HIGH SENSITIVITY)
Troponin I (High Sensitivity): 5 ng/L (ref <18)
Troponin I (High Sensitivity): 6 ng/L (ref <18)

## 2022-07-09 LAB — BRAIN NATRIURETIC PEPTIDE: B Natriuretic Peptide: 91.3 pg/mL (ref 0.0–100.0)

## 2022-07-09 LAB — MAGNESIUM: Magnesium: 2.2 mg/dL (ref 1.7–2.4)

## 2022-07-09 MED ORDER — ACETAMINOPHEN 500 MG PO TABS
1000.0000 mg | ORAL_TABLET | Freq: Once | ORAL | Status: AC
  Administered 2022-07-09: 1000 mg via ORAL
  Filled 2022-07-09: qty 2

## 2022-07-09 MED ORDER — IOHEXOL 350 MG/ML SOLN
100.0000 mL | Freq: Once | INTRAVENOUS | Status: AC | PRN
  Administered 2022-07-09: 100 mL via INTRAVENOUS

## 2022-07-09 NOTE — ED Provider Notes (Signed)
[place] EMERGENCY DEPARTMENT AT [facility] Provider Note   CSN: [id number] Arrival date & time: 07/09/22  1453     History {Add pertinent medical, surgical, social history, OB history to HPI:1} Chief Complaint  Patient presents with   Chest Pain    Sheryl Turner is a 67 y.o. female.  Patient is a 67 year old female with a history of pancreatitis, hypertension, hypothyroidism, hyperlipidemia who has been off medication due to insurance issues and is getting ready to start back on it who is presenting today with multiple complaints.  Patient symptoms started on Sunday when she was sitting watching TV and started having sharp pains in her left chest behind her breast.  They have been coming and going since Sunday but seem to be getting a little bit worse.  She is also noticed shortness of breath going on for a while now but has worsened since the symptoms started.  She is also felt like she has had a lot of burping and has noticed pain in her back mid to lower.  Also in the last few days she started to notice that she is having a numb tingly sensation in the left side of her face arm and leg.  At times she even feels that her left arm and leg are heavy.  She has had no falls or difficulty walking.  After arriving in the emergency room she also developed a headache but denies any visual changes.  She has not had any nausea or vomiting.  She does not have a history of MI  The history is provided by the patient, the spouse and medical records.  Chest Pain      Home Medications Prior to Admission medications   Medication Sig Start Date End Date Taking? Authorizing Provider  amLODipine-benazepril (LOTREL) 5-20 MG capsule Take 1 capsule by mouth once daily 06/09/22   Tolia, Sunit, DO  aspirin EC 81 MG tablet Take 1 tablet (81 mg total) by mouth daily. Swallow whole. 09/05/21   Tolia, Sunit, DO  Cholecalciferol (VITAMIN D3 SUPER STRENGTH) 50 MCG (2000 UT) TABS Take 2,000 Units by  mouth daily.    [provider]  cyclobenzaprine (FLEXERIL) 10 MG tablet Take 10 mg by mouth 3 (three) times daily as needed for muscle spasms.  01/15/15   [provider]  ezetimibe (ZETIA) 10 MG tablet Take 1 tablet by mouth daily. 06/24/21 06/19/22  [provider]  fluticasone (FLONASE) 50 MCG/ACT nasal spray Place 2 sprays into both nostrils daily as needed for allergies or rhinitis.  10/28/16 06/03/22  [provider]  ketoconazole (NIZORAL) 2 % shampoo Apply topically. 06/06/21   [provider]  levothyroxine (SYNTHROID) 112 MCG tablet Take 1 tablet by mouth daily. 03/23/21   [provider]  metoprolol succinate (TOPROL XL) 50 MG 24 hr tablet Take 1 tablet (50 mg total) by mouth daily. 03/12/22 06/10/22  Tolia, Sunit, DO  Probiotic Product (PROBIOTIC PO) Take 1 capsule by mouth daily.    [provider]  Simethicone (GAS-X EXTRA STRENGTH PO) Gas-X    [provider]  triamcinolone cream (KENALOG) 0.1 % Apply 1 application topically daily as needed (for itchiness on scalp).  08/25/16   [provider]  Triprolidine-Pseudoephedrine (ANTIHISTAMINE PO) allegra    [provider]      Allergies    Darvocet [propoxyphene n-acetaminophen], Penicillins, Lipitor [atorvastatin], and Adhesive [tape]    Review of Systems   Review of Systems  Cardiovascular:  Positive for  chest pain.    Physical Exam Updated Vital Signs BP 113/61 (BP Location: Right Arm)   Pulse 66   Temp 98.1 F (36.7 C) (Oral)   Resp 20   Ht  (1.6 m)   Wt 101.9 kg   SpO2 99%   BMI 39.79 kg/m  Physical Exam Vitals and nursing note reviewed.  Constitutional:      General: She is not in acute distress.    Appearance: She is well-developed.  HENT:     Head: Normocephalic and atraumatic.  Eyes:     Pupils: Pupils are equal, round, and reactive to light.  Cardiovascular:     Rate and Rhythm: Normal rate and regular rhythm.      Heart sounds: Normal heart sounds. No murmur heard.    No friction rub.  Pulmonary:     Effort: Pulmonary effort is normal.     Breath sounds: Normal breath sounds. No wheezing or rales.  Abdominal:     General: Bowel sounds are normal. There is no distension.     Palpations: Abdomen is soft.     Tenderness: There is no abdominal tenderness. There is no right CVA tenderness, left CVA tenderness, guarding or rebound.  Musculoskeletal:        General: No tenderness. Normal range of motion.     Cervical back: Normal range of motion and neck supple.     Comments: No edema  Skin:    General: Skin is warm and dry.     Findings: No rash.  Neurological:     Mental Status: She is alert and oriented to person, place, and time.     Cranial Nerves: No cranial nerve deficit, dysarthria or facial asymmetry.     Sensory: Sensory deficit present.     Motor: Weakness present.     Coordination: Coordination is intact.     Comments: Decreased sensation over the left cheek.  4/5 strength in the left leg.  5/5 strength in all other ext.  No pronator drift  Psychiatric:        Behavior: Behavior normal.     ED Results / Procedures / Treatments   Labs (all labs ordered are listed, but only abnormal results are displayed) Labs Reviewed  BASIC METABOLIC PANEL - Abnormal; Notable for the following components:      Result Value   CO2 21 (*)    All other components within normal limits  CBC WITH DIFFERENTIAL/PLATELET  BRAIN NATRIURETIC PEPTIDE  MAGNESIUM  TROPONIN I (HIGH SENSITIVITY)  TROPONIN I (HIGH SENSITIVITY)    EKG None  Radiology DG Chest 2 View  Result Date: 07/09/2022 CLINICAL DATA:  Provided history: Chest pain. EXAM: CHEST - 2 VIEW COMPARISON:  Prior chest radiographs 03/10/2017 and FINDINGS: Heart size within normal limits. Small ill-defined opacities within the left lung base most compatible with atelectasis. No appreciable airspace consolidation on the right. No evidence of  pleural effusion or pneumothorax. No acute osseous abnormality identified. Degenerative changes of the spine. Dextrocurvature of the thoracic spine. Surgical clips within the right upper quadrant of the abdomen. IMPRESSION: 1. Mild atelectasis within the left lung base. 2. Otherwise, no evidence of acute cardiopulmonary abnormality. Electronically Signed   By: Jackey Loge D.O.   On: 07/09/2022 16:09    Procedures Procedures  {Document cardiac monitor, telemetry assessment procedure when appropriate:1}  Medications Ordered in ED Medications  acetaminophen (TYLENOL) tablet 1,000 mg (1,000 mg Oral Given 07/09/22 2057)  iohexol (OMNIPAQUE) 350 MG/ML injection 100 mL (  100 mLs Intravenous Contrast Given 07/09/22 2301)    ED Course/ Medical Decision Making/ A&P   {   Click here for ABCD2, HEART and other calculatorsREFRESH Note before signing :1}                          Medical Decision Making Amount and/or Complexity of Data Reviewed Radiology: ordered.  Risk OTC drugs. Prescription drug management.   Pt with multiple medical problems and comorbidities and presenting today with a complaint that caries a high risk for morbidity and mortality.  Here today with multiple complaints.  Concern for possible dissection, stroke, electrolyte abnormality, GI etiology, complicated migraine.  Lower suspicion for PE, MI or infectious etiology.  Patient has equal pulses throughout however does have some neurofindings on the left side.  She is not hypertensive or tachycardic at this time.  I independently interpreted patient's EKG and labs.  CBC, BMP, troponin x 2, BNP and magnesium are all within normal limits.  EKG without acute findings. Head CT and CTA of the chest abdomen pelvis pending to rule out above.  If negative patient will need MRI to rule out stroke.   {Document critical care time when appropriate:1} {Document review of labs and clinical decision tools ie heart score, Chads2Vasc2 etc:1}   {Document your independent review of radiology images, and any outside records:1} {Document your discussion with family members, caretakers, and with consultants:1} {Document social determinants of health affecting pt's care:1} {Document your decision making why or why not admission, treatments were needed:1} Final Clinical Impression(s) / ED Diagnoses Final diagnoses:  None    Rx / DC Orders ED Discharge Orders     None

## 2022-07-09 NOTE — ED Triage Notes (Signed)
The pt is c/o lt sided chest pain lt arm and the lt side of her face since Sunday.  Speech clear she walked into the triage arrea  much gas  which is abnormal

## 2022-07-09 NOTE — ED Provider Triage Note (Signed)
Emergency Medicine Provider Triage Evaluation Note  Sheryl Turner , a 67 y.o. female  was evaluated in triage.  Pt complains of chest pain.  Radiates to left arm.  Also endorses numbness to left side of jaw.  States she has not had exertional dyspnea recently.  Denies any significant cardiac disease.  She does see a cardiologist for the exertional dyspnea.  Review of Systems  Positive: As above Negative: As abov  Physical Exam  BP 133/75 (BP Location: Right Arm)   Pulse 69   Temp 98.3 F (36.8 C) (Oral)   Resp 16   SpO2 99%  Gen:   Awake, no distress   Resp:  Normal effort  MSK:   Moves extremities without difficulty  Other:    Medical Decision Making  Medically screening exam initiated at 3:13 PM.  Appropriate orders placed.  Sheryl Turner was informed that the remainder of the evaluation will be completed by another provider, this initial triage assessment does not replace that evaluation, and the importance of remaining in the ED until their evaluation is complete.     Sheryl Kansas, PA-C 07/09/22 1514

## 2022-07-10 ENCOUNTER — Emergency Department (HOSPITAL_COMMUNITY): Payer: Medicare Other

## 2022-07-10 LAB — HEPATIC FUNCTION PANEL
ALT: 25 U/L (ref 0–44)
AST: 23 U/L (ref 15–41)
Albumin: 3.6 g/dL (ref 3.5–5.0)
Alkaline Phosphatase: 45 U/L (ref 38–126)
Bilirubin, Direct: <0.1 mg/dL (ref 0.0–0.2)
Total Bilirubin: 0.6 mg/dL (ref 0.3–1.2)
Total Protein: 6.6 g/dL (ref 6.5–8.1)

## 2022-07-10 LAB — LIPASE, BLOOD: Lipase: 43 U/L (ref 11–51)

## 2022-07-10 NOTE — ED Provider Notes (Signed)
Received patient in turnover from Dr. Anitra Lauth.  Please see their note for further details of Hx, PE.  Briefly patient is a 67 y.o. female with a Chest Pain .  15 yoF with a chief complaint of chest pain and difficulty breathing.  This is an ongoing issue for her.  Has had 2 negative troponins here.  She also was found to have some perhaps left-sided weakness on exam.  Had been complaining of some paresthesias to the left side..  Plan for MRI.  MRI is negative for any acute stroke.  I discussed the results with the family and patient.  And LFTs and lipase are also unremarkable.  I am not sure of the incidental finding that they saw on the patient's biliary tract.    Melene Plan, DO 07/10/22 Earle Gell

## 2022-07-10 NOTE — ED Notes (Signed)
Pt back to room from MRI.

## 2022-07-10 NOTE — Discharge Instructions (Signed)
Follow up with your doctor in the office.  Return for worsening chest pain especially upon exertion.

## 2022-07-23 ENCOUNTER — Other Ambulatory Visit: Payer: Self-pay | Admitting: Cardiology

## 2022-07-23 DIAGNOSIS — R0602 Shortness of breath: Secondary | ICD-10-CM

## 2022-07-23 DIAGNOSIS — I10 Essential (primary) hypertension: Secondary | ICD-10-CM

## 2022-08-23 ENCOUNTER — Other Ambulatory Visit: Payer: Self-pay | Admitting: Cardiology

## 2022-08-23 DIAGNOSIS — I10 Essential (primary) hypertension: Secondary | ICD-10-CM

## 2022-08-23 DIAGNOSIS — R0602 Shortness of breath: Secondary | ICD-10-CM

## 2022-09-23 IMAGING — CT CT CARDIAC CORONARY ARTERY CALCIUM SCORE
3 series · 14 of 20 positions shown, 16 images · non-contrast
Comparison: None Available.

CLINICAL DATA: Hyperlipidemia 66-year-old female.

EXAM:
CT CARDIAC CORONARY ARTERY CALCIUM SCORE
TECHNIQUE: Non-contrast imaging through the heart was performed using
prospective ECG gating. Image post processing was performed on an
independent workstation, allowing for quantitative analysis of the
heart and coronary arteries. Note that this exam targets the heart
and the chest was not imaged in its entirety.

[Series 2: calcium scoring 2.00 qr36 bestdiast 71% hrt calciu · axial · 0.35mm/px · z∈[+1683,+1767]mm · 4 of 70 slices shown]
[im 14/70  vessel]
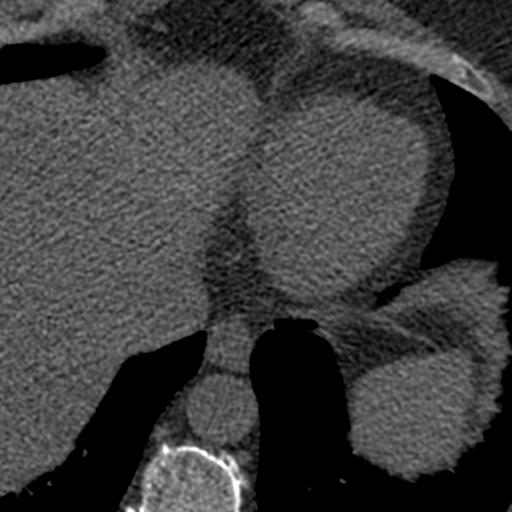
[im 28/70  vessel]
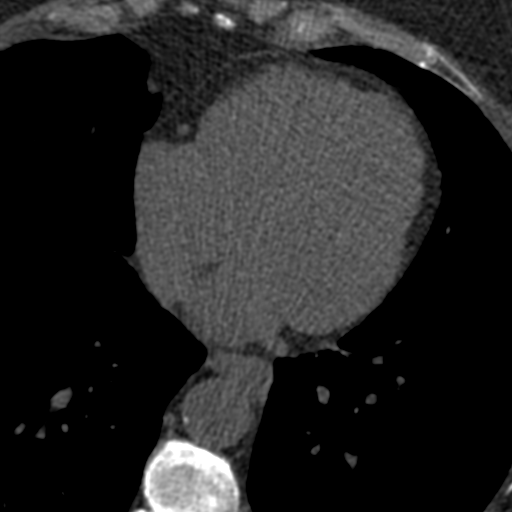
[im 42/70  vessel]
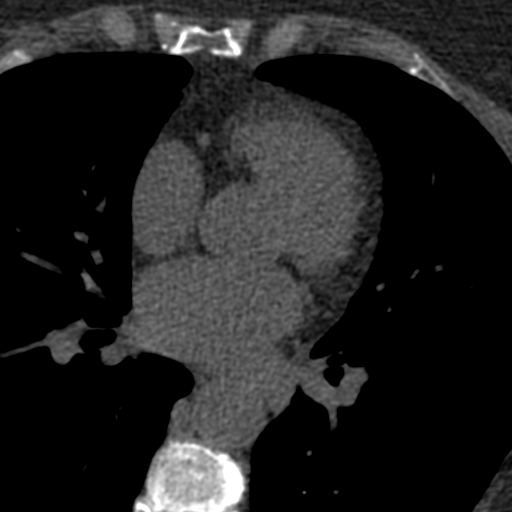
[im 56/70  vessel]
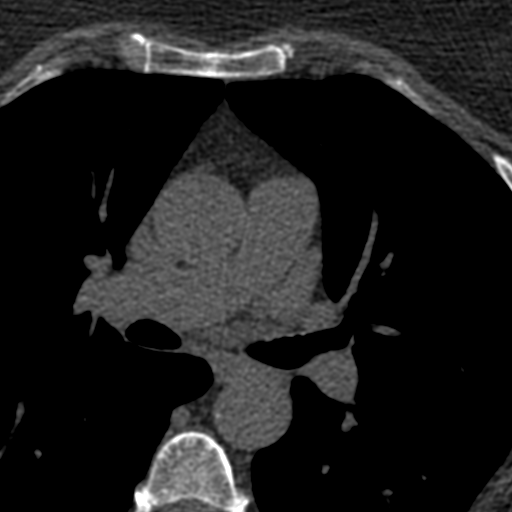

[Series 3: calcium scoring 2.00 br40 bestdiast 71% axial · axial · 0.61mm/px · z∈[+1679,+1771]mm · 5 of 70 slices shown, 7 images]
[im 12/70  vessel]
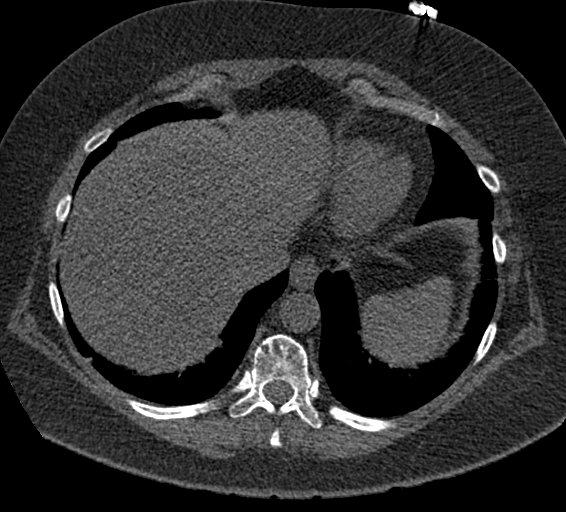
[im 12/70  lung]
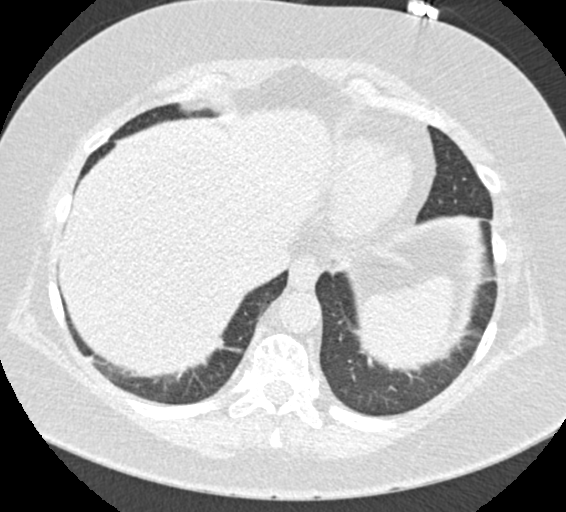
[im 24/70  vessel]
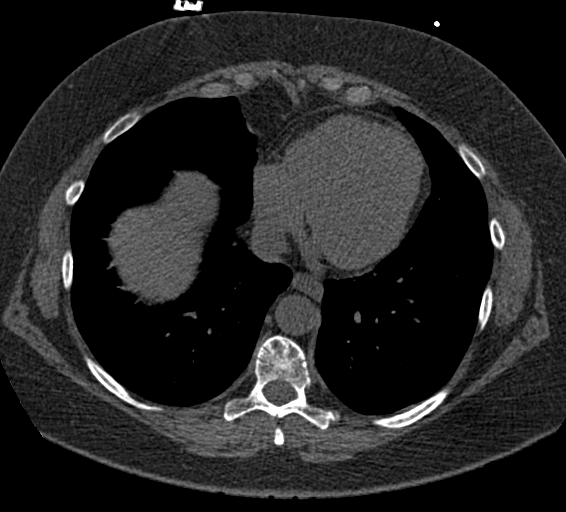
[im 35/70  vessel]
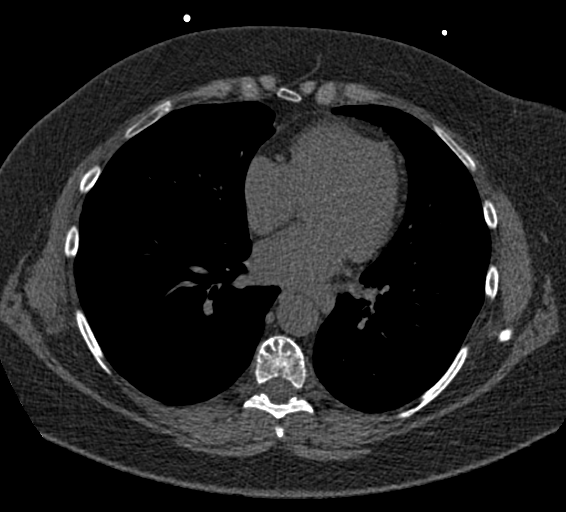
[im 47/70  vessel]
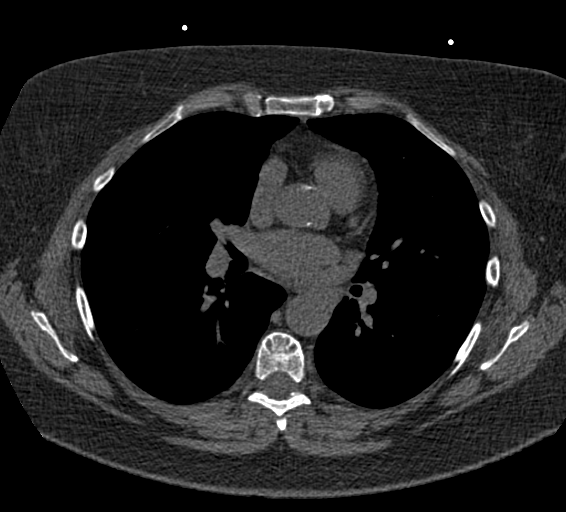
[im 58/70  vessel]
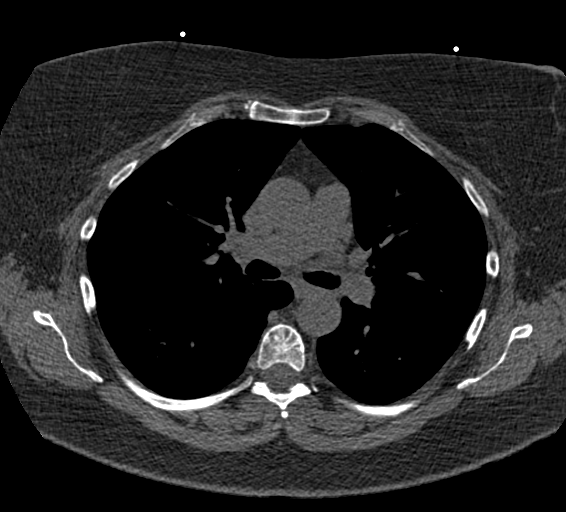
[im 58/70  lung]
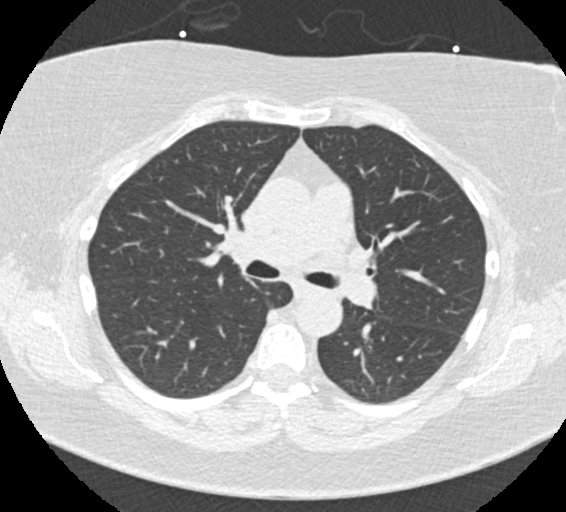

[Series 9: calcium scoring 2.00 br60 bestdiast 71% lungs · axial · 0.61mm/px · z∈[+1679,+1771]mm · 5 of 70 slices shown]
[im 12/70  vessel]
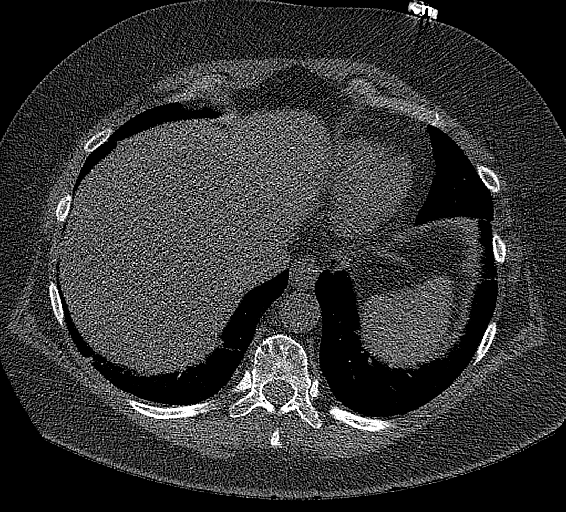
[im 24/70  vessel]
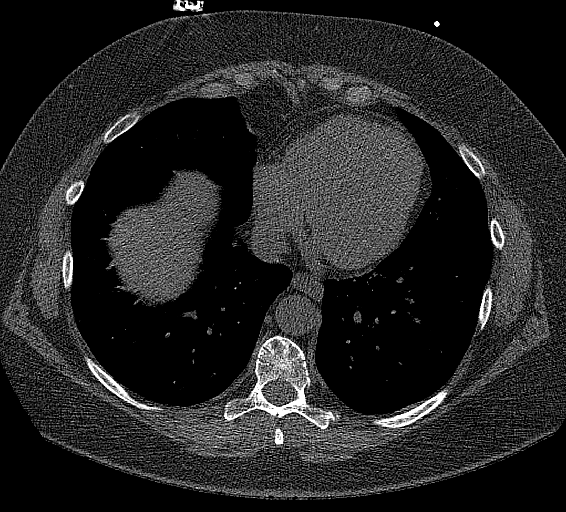
[im 35/70  vessel]
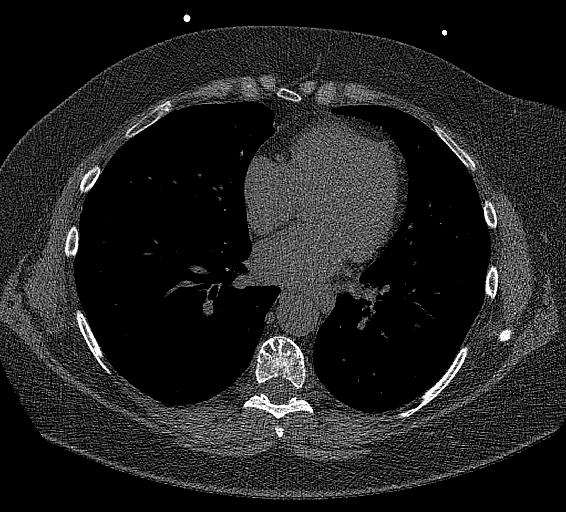
[im 47/70  vessel]
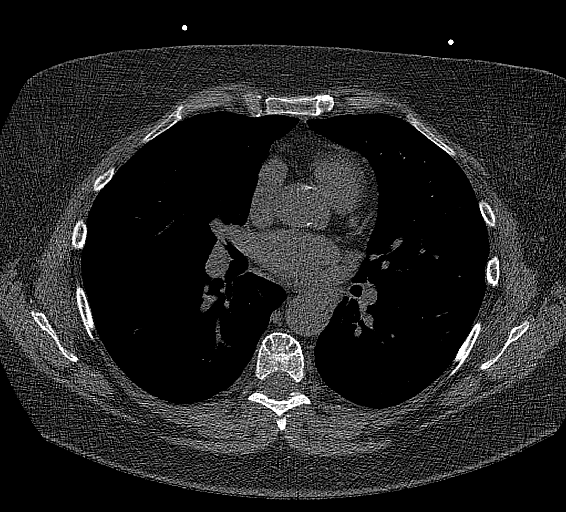
[im 58/70  vessel]
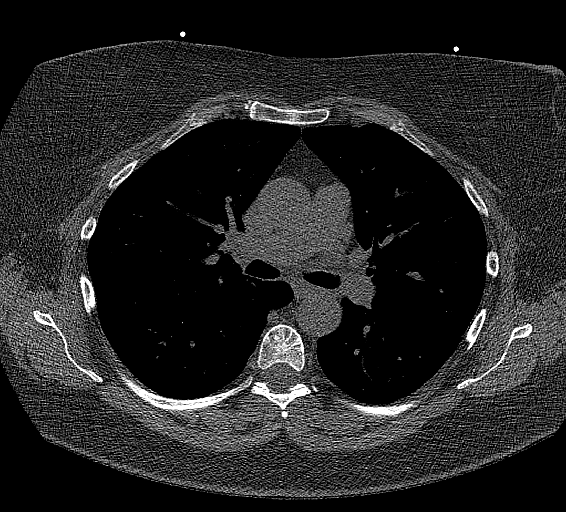

[14 of 20 positions shown; findings below may reference images not displayed]

FINDINGS: Technical quality: Good

CORONARY CALCIUM SCORES:

Left Main: 58

LAD: 32

LCx: 2

RCA: 8

CORONARY CALCIUM

Total Agatston Score: 100

[HOSPITAL] percentile: 83

Ascending aorta (normal <  40 mm): 31 mm

EXTRACARDIAC FINDINGS:

Limited view of the lung parenchyma demonstrates no suspicious
nodularity. Airways are normal.

Limited view of the mediastinum demonstrates no adenopathy.
Esophagus normal.

Limited view of the upper abdomen is unremarkable.

Limited view of the skeleton and chest wall is unremarkable.
IMPRESSION: 1. Three-vessel coronary artery calcification.

2. Total Agatston Score: 100

3. MESA age and sex matched database percentile: 83th

## 2022-11-27 ENCOUNTER — Other Ambulatory Visit: Payer: Self-pay | Admitting: Cardiology

## 2022-11-27 DIAGNOSIS — I10 Essential (primary) hypertension: Secondary | ICD-10-CM

## 2022-11-27 DIAGNOSIS — R0602 Shortness of breath: Secondary | ICD-10-CM

## 2022-12-08 ENCOUNTER — Ambulatory Visit: Payer: Medicare Other | Admitting: Cardiology

## 2023-02-03 ENCOUNTER — Other Ambulatory Visit: Payer: Self-pay | Admitting: Family Medicine

## 2023-02-03 DIAGNOSIS — E2839 Other primary ovarian failure: Secondary | ICD-10-CM

## 2023-02-22 ENCOUNTER — Other Ambulatory Visit: Payer: Self-pay | Admitting: Cardiology

## 2023-02-22 DIAGNOSIS — I10 Essential (primary) hypertension: Secondary | ICD-10-CM

## 2023-02-22 DIAGNOSIS — R0602 Shortness of breath: Secondary | ICD-10-CM

## 2023-03-05 ENCOUNTER — Institutional Professional Consult (permissible substitution) (INDEPENDENT_AMBULATORY_CARE_PROVIDER_SITE_OTHER): Payer: Medicare Other

## 2023-03-13 NOTE — Progress Notes (Unsigned)
  [address], [address] [place], [place] [address] [phone]  Audiological Evaluation    Name: Sheryl Turner     DOB:   1956/01/12      MRN:   [id number]                                                                                     Service Date: 03/13/2023     Accompanied by: unaccompanied    Patient comes today after Eyvonne Mechanic, PA-C sent a referral for a hearing evaluation due to concerns with tinnitus.   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  Amplification  []    Other  []      Otoscopy: Right ear: Clear external ear canals and notable landmarks visualized on the tympanic membrane. Left ear:  Clear external ear canals and notable landmarks visualized on the tympanic membrane.  Tympanometry: Right ear: Type A- Normal external ear canal volume with normal middle ear pressure and tympanic membrane compliance Left ear: Type A- Normal external ear canal volume with normal middle ear pressure and tympanic membrane compliance    Pure tone Audiometry: Right ear- Normal to mild  sensorineural hearing loss from 250 Hz - 8000 Hz. Left ear-  Normal to mild sensorineural hearing loss from 250 Hz - 8000 Hz.  The hearing test results were completed under headphones and results are deemed to be of good reliability. Test technique:  conventional     Speech Audiometry: Right ear- Speech Reception Threshold (SRT) was obtained at 15 dBHL Left ear-Speech Reception Threshold (SRT) was obtained at 10 dBHL   Word Recognition Score Tested using NU-6 (MLV) Right ear: 100% was obtained at a presentation level of 60 dBHL with contralateral masking which  is deemed as  excellent Left ear: 100% was obtained at a presentation level of 60 dBHL with contralateral masking which is deemed as  excellent    Impression: There is not a significant difference in pure-tone thresholds between ears. There is not a significant difference in the word recognition score in between ears.    Recommendations: Follow up with ENT as scheduled for today. Use hearing protection when exposed to loud/damaging sounds. Consider various tinnitus strategies, including the use of a sound generator, hearing aids, and/or tinnitus retraining therapy.  Consider a communication needs assessment after medical clearance for hearing aids is obtained.   Kortland Nichols MARIE LEROUX-MARTINEZ, AUD

## 2023-03-16 ENCOUNTER — Ambulatory Visit (INDEPENDENT_AMBULATORY_CARE_PROVIDER_SITE_OTHER): Payer: Medicare Other | Admitting: Audiology

## 2023-03-16 ENCOUNTER — Ambulatory Visit (INDEPENDENT_AMBULATORY_CARE_PROVIDER_SITE_OTHER): Payer: Medicare Other

## 2023-03-16 DIAGNOSIS — J302 Other seasonal allergic rhinitis: Secondary | ICD-10-CM | POA: Diagnosis not present

## 2023-03-16 DIAGNOSIS — H903 Sensorineural hearing loss, bilateral: Secondary | ICD-10-CM

## 2023-03-16 DIAGNOSIS — H9313 Tinnitus, bilateral: Secondary | ICD-10-CM

## 2023-03-16 MED ORDER — AZELASTINE HCL 0.1 % NA SOLN: 2.0000 | Freq: Two times a day (BID) | NASAL | 12 refills | Status: AC

## 2023-03-16 NOTE — Progress Notes (Signed)
Dear Dr. Fayrene Fearing, Here is my assessment for our mutual patient, Sheryl Turner. Thank you for allowing me the opportunity to care for your patient. Please do not hesitate to contact me should you have any other questions. Sincerely, Burna Forts PA-C  Otolaryngology Clinic Note Referring provider: Dr. Fayrene Fearing HPI:  Sheryl Turner is a 67 y.o. female kindly referred by Dr. Fayrene Fearing   The patient presents today with several complaints.  The patient notes that she has had tinnitus for several years.  She notes a ringing/humming high-pitched sound in the bilateral ears.  She is not certain if either ear is worse.  She notes that the symptoms can last for days and go away for days at a time.  She notes that they are worse at night when things are quiet, she uses the TV to help drown out the noise which also helps her sleep.  She denies any rhythm to the tinnitus, this does not go with her heartbeat.  She denies any neurologic symptoms.  She denies any significant noise exposure, no head or neck trauma, no head or neck cancer.  She did have radioactive iodine for hyperthyroidism.  She denies any history of hearing loss or reoccurring ear infections as an adult or as a child.  In addition to the tinnitus she does note intermittent shooting pain in her ears, she notes this is often upon awakening and goes away after an hour or so, this does not persist.  She denies any drainage fever or any infectious signs or symptoms.  She does note a history of seasonal allergies which she struggles with.  She notes she uses Flonase and takes Allegra, but she continues to feel congested daily.  She has not had any allergy testing.  She notes she had an episode of vertigo 4 years ago and has not had an episode since.     H&N Surgery: no Personal or FHx of bleeding dz or anesthesia difficulty: no   GLP-1: no AP/AC: ASA  Tobacco: quit 24 years ago. Alcohol: occasional. Occupation: semi-retired. Lives with husband   Independent  Review of Additional Tests or Records:  03/17/2023 Audiogram was independently reviewed and interpreted by me and it reveals Right ear: Normal to mild sensorineural hearing loss from 250 through 8000 Hz; 100% word interpretation at 60 dB; type a tympanogram Left ear: Normal to mild sensorineural hearing loss from 250 through 8000 Hz; 100% word interpretation at 60 dB; type a tympanogram  SNHL= Sensorineural hearing loss    PMH/Meds/All/SocHx/FamHx/ROS:   Past Medical History:  Diagnosis Date   GERD (gastroesophageal reflux disease)    H/O hiatal hernia    Hyperlipidemia    Hyperthyroidism    Hypothyroidism    Jaundice 09/14/2012   noticed in eyes, /w obstruction    Pancreatitis    09-14-12 recent Hospital stay [facility]   Thyroid disease      Past Surgical History:  Procedure Laterality Date   ABDOMINAL HYSTERECTOMY     APPENDECTOMY     CESAREAN SECTION     CHOLECYSTECTOMY N/A 11/26/2012   Procedure: LAPAROSCOPIC CHOLECYSTECTOMY WITH INTRAOPERATIVE CHOLANGIOGRAM;  Surgeon: Atilano Ina, MD;  Location[facility];  Service: General;  Laterality: N/A;   ERCP N/A 09/16/2012   Procedure: ENDOSCOPIC RETROGRADE CHOLANGIOPANCREATOGRAPHY (ERCP);  Surgeon: Petra Kuba, MD;  Location: [facility] OR;  Service: Endoscopy;  Laterality: N/A;   ERCP N/A 10/20/2012   Procedure: ENDOSCOPIC RETROGRADE CHOLANGIOPANCREATOGRAPHY (ERCP);  Surgeon: Petra Kuba, MD;  Location: [facility] ENDOSCOPY;  Service: Endoscopy;  Laterality: N/A;   EUS Left 09/15/2012   Procedure: UPPER ENDOSCOPIC ULTRASOUND (EUS) RADIAL, possible LINEAR to follow, possible side-viewing endoscope;  Surgeon: Willis Modena, MD;  Location: [facility] ENDOSCOPY;  Service: Endoscopy;  Laterality: Left;   SPYGLASS CHOLANGIOSCOPY N/A 10/20/2012   Procedure: [id number] CHOLANGIOSCOPY;  Surgeon: Petra Kuba, MD;  Location: [facility] ENDOSCOPY;  Service: Endoscopy;  Laterality: N/A;   TUBAL LIGATION      Family History  Problem Relation Age of Onset   Cerebral aneurysm Mother     Heart failure Brother      Social Connections: Unknown (07/27/2021)   Received from [facility]   Social Network    Social Network: Not on file      Current Outpatient Medications:    amLODipine-benazepril (LOTREL) 5-20 MG capsule, Take 1 capsule by mouth once daily, Disp: 90 capsule, Rfl: 0   aspirin EC 81 MG tablet, Take 1 tablet (81 mg total) by mouth daily. Swallow whole., Disp: 30 tablet, Rfl: 12   Cholecalciferol (VITAMIN D3 SUPER STRENGTH) 50 MCG (2000 UT) TABS, Take 2,000 Units by mouth daily., Disp: , Rfl:    cyclobenzaprine (FLEXERIL) 10 MG tablet, Take 10 mg by mouth 3 (three) times daily as needed for muscle spasms. , Disp: , Rfl:    ezetimibe (ZETIA) 10 MG tablet, Take 1 tablet by mouth daily., Disp: , Rfl:    fluticasone (FLONASE) 50 MCG/ACT nasal spray, Place 2 sprays into both nostrils daily as needed for allergies or rhinitis. , Disp: , Rfl:    ketoconazole (NIZORAL) 2 % shampoo, Apply topically., Disp: , Rfl:    levothyroxine (SYNTHROID) 112 MCG tablet, Take 1 tablet by mouth daily., Disp: , Rfl:    metoprolol succinate (TOPROL XL) 50 MG 24 hr tablet, Take 1 tablet (50 mg total) by mouth daily., Disp: 90 tablet, Rfl: 0   Probiotic Product (PROBIOTIC PO), Take 1 capsule by mouth daily., Disp: , Rfl:    Simethicone (GAS-X EXTRA STRENGTH PO), Gas-X, Disp: , Rfl:    triamcinolone cream (KENALOG) 0.1 %, Apply 1 application topically daily as needed (for itchiness on scalp). , Disp: , Rfl:    Triprolidine-Pseudoephedrine (ANTIHISTAMINE PO), allegra, Disp: , Rfl:    Physical Exam:   There were no vitals taken for this visit.  Pertinent Findings  CN II-XII intact  Bilateral EAC clear and TM intact with well pneumatized middle ear spaces Weber 512: equal Rinne 512: AC > BC b/l  Anterior rhinoscopy: Septum midline; bilateral inferior turbinates with hypertrophy and pale No lesions of oral cavity/oropharynx; dentition WNL No obviously palpable neck  masses/lymphadenopathy/thyromegaly No respiratory distress or stridor  Seprately Identifiable Procedures:  None  Impression & Plans:  Sheryl Turner is a 67 y.o. female with the following   Tinnitus -  Patient presents today with nonpulsatile tinnitus.  She does have some minimal hearing loss on audiogram today although this is not severe.  I have very low suspicion for any vascular etiology of the tinnitus or any other acute life-threatening etiology.  I would recommend masking the noise to help with symptomatic control, if the symptoms change in characteristics or intensity, or she has any concerning signs or symptoms I like to see her back in the office for follow-up evaluation.  Allergic Rhinitis-  Patient has ongoing allergic rhinitis that has not responded well to initial therapy of Flonase, oral antihistamines.  I have added azelastine to her treatment regimen.  I do think she would benefit  from evaluation by an allergist.  I am happy to continue to follow her or see her at any point for ongoing symptoms.   - f/u PRN   Thank you for allowing me the opportunity to care for your patient. Please do not hesitate to contact me should you have any other questions.  Sincerely, Burna Forts PA-C [facility] Phone: [phone] Fax: [phone]  03/16/2023, 11:40 AM

## 2023-04-24 NOTE — Progress Notes (Deleted)
 NEW PATIENT Date of Service/Encounter:  04/24/23 Referring provider: Eyvonne Mechanic, PA-C Primary care provider: Roger Kill, PA-C  Subjective:  Sheryl Turner is a 68 y.o. female with a PMHx of *** presenting today for evaluation of *** History obtained from: chart review and {Persons; PED relatives w/patient:19415::"patient"}.   Discussed the use of AI scribe software for clinical note transcription with the patient, who gave verbal consent to proceed.  History of Present Illness            Chart Review:  Reviewed PCP notes from referral 03/16/23: allergic rhinitis, not responsive to flonase + AH, INAH added, referreal to AI  Other allergy screening: Asthma: {Blank single:19197::"yes","no"} Rhino conjunctivitis: {Blank single:19197::"yes","no"} Food allergy: {Blank single:19197::"yes","no"} Medication allergy: {Blank single:19197::"yes","no"} Hymenoptera allergy: {Blank single:19197::"yes","no"} Urticaria: {Blank single:19197::"yes","no"} Eczema:{Blank single:19197::"yes","no"} History of recurrent infections suggestive of immunodeficency: {Blank single:19197::"yes","no"} ***Vaccinations are up to date.   Past Medical History: Past Medical History:  Diagnosis Date   GERD (gastroesophageal reflux disease)    H/O hiatal hernia    Hyperlipidemia    Hyperthyroidism    Hypothyroidism    Jaundice 09/14/2012   noticed in eyes, /w obstruction    Pancreatitis    09-14-12 recent Hospital stay [facility]   Thyroid disease    Medication List:  Current Outpatient Medications  Medication Sig Dispense Refill   amLODipine-benazepril (LOTREL) 5-20 MG capsule Take 1 capsule by mouth once daily 90 capsule 0   aspirin EC 81 MG tablet Take 1 tablet (81 mg total) by mouth daily. Swallow whole. 30 tablet 12   azelastine (ASTELIN) 0.1 % nasal spray Place 2 sprays into both nostrils 2 (two) times daily. Use in each nostril as directed 30 mL 12   Cholecalciferol (VITAMIN D3 SUPER  STRENGTH) 50 MCG (2000 UT) TABS Take 2,000 Units by mouth daily.     cyclobenzaprine (FLEXERIL) 10 MG tablet Take 10 mg by mouth 3 (three) times daily as needed for muscle spasms.      ezetimibe (ZETIA) 10 MG tablet Take 1 tablet by mouth daily.     fluticasone (FLONASE) 50 MCG/ACT nasal spray Place 2 sprays into both nostrils daily as needed for allergies or rhinitis.      ketoconazole (NIZORAL) 2 % shampoo Apply topically.     levothyroxine (SYNTHROID) 112 MCG tablet Take 1 tablet by mouth daily.     metoprolol succinate (TOPROL XL) 50 MG 24 hr tablet Take 1 tablet (50 mg total) by mouth daily. 90 tablet 0   Probiotic Product (PROBIOTIC PO) Take 1 capsule by mouth daily.     Simethicone (GAS-X EXTRA STRENGTH PO) Gas-X     triamcinolone cream (KENALOG) 0.1 % Apply 1 application topically daily as needed (for itchiness on scalp).      Triprolidine-Pseudoephedrine (ANTIHISTAMINE PO) allegra     No current facility-administered medications for this visit.   Known Allergies:  Allergies  Allergen Reactions   Darvocet [Propoxyphene N-Acetaminophen] Nausea And Vomiting   Penicillins Other (See Comments)    Orally = mouth ulcers Has patient had a PCN reaction causing immediate rash, facial/tongue/throat swelling, SOB or lightheadedness with hypotension: Yes Has patient had a PCN reaction causing severe rash involving mucus membranes or skin necrosis: No Has patient had a PCN reaction that required hospitalization: No Has patient had a PCN reaction occurring within the last 10 years: Yes If all of the above answers are "NO", then may proceed with Cephalosporin use.     Lipitor [Atorvastatin] Other (See Comments)  Causes muscle soreness   Adhesive [Tape] Other (See Comments)    Skin irritation    Past Surgical History: Past Surgical History:  Procedure Laterality Date   ABDOMINAL HYSTERECTOMY     APPENDECTOMY     CESAREAN SECTION     CHOLECYSTECTOMY N/A 11/26/2012   Procedure:  LAPAROSCOPIC CHOLECYSTECTOMY WITH INTRAOPERATIVE CHOLANGIOGRAM;  Surgeon: Atilano Ina, MD;  Location: [facility] OR;  Service: General;  Laterality: N/A;   ERCP N/A 09/16/2012   Procedure: ENDOSCOPIC RETROGRADE CHOLANGIOPANCREATOGRAPHY (ERCP);  Surgeon: Petra Kuba, MD;  Location: [facility] OR;  Service: Endoscopy;  Laterality: N/A;   ERCP N/A 10/20/2012   Procedure: ENDOSCOPIC RETROGRADE CHOLANGIOPANCREATOGRAPHY (ERCP);  Surgeon: Petra Kuba, MD;  Location: [facility];  Service: Endoscopy;  Laterality: N/A;   EUS Left 09/15/2012   Procedure: UPPER ENDOSCOPIC ULTRASOUND (EUS) RADIAL, possible LINEAR to follow, possible side-viewing endoscope;  Surgeon: Willis Modena, MD;  Location: [facility];  Service: Endoscopy;  Laterality: Left;   SPYGLASS CHOLANGIOSCOPY N/A 10/20/2012   Procedure: [id number] CHOLANGIOSCOPY;  Surgeon: Petra Kuba, MD;  Location: [facility]COPY;  Service: Endoscopy;  Laterality: N/A;   TUBAL LIGATION     Family History: Family History  Problem Relation Age of Onset   Cerebral aneurysm Mother    Heart failure Brother    Social History: Sheryl Turner lives ***.   ROS:  All other systems negative except as noted per HPI.  Objective:  There were no vitals taken for this visit. There is no height or weight on file to calculate BMI. Physical Exam:  General Appearance:  Alert, cooperative, no distress, appears stated age  Head:  Normocephalic, without obvious abnormality, atraumatic  Eyes:  Conjunctiva clear, EOM's intact  Ears {Blank multiple:19196:a:"***","EACs normal bilaterally","normal TMs bilaterally","ear tubes present bilaterally without exudate"}  Nose: Nares normal, {Blank multiple:19196:a:"***","hypertrophic turbinates","normal mucosa","no visible anterior polyps","septum midline"}  Throat: Lips, tongue normal; teeth and gums normal, {Blank multiple:19196:a:"***","normal posterior oropharynx","tonsils 2+","tonsils 3+","no tonsillar exudate","+ cobblestoning","surgically absent  tonsils","mildly erythematous posterior oropharynx"}  Neck: Supple, symmetrical  Lungs:   {Blank multiple:19196:a:"***","clear to auscultation bilaterally","end-expiratory wheezing","wheezing throughout"}, Respirations unlabored, {Blank multiple:19196:a:"***","no coughing","intermittent dry coughing","intermittent productive-sounding cough"}  Heart:  {Blank multiple:19196:a:"***","regular rate and rhythm","no murmur"}, Appears well perfused  Extremities: No edema  Skin: {Blank multiple:19196:a:"***","erythematous, dry patches scattered on ***","lichenification on ***","Skin color, texture, turgor normal","no rashes or lesions on visualized portions of skin"}  Neurologic: No gross deficits   Diagnostics: Spirometry:  Tracings reviewed. Her effort: {Blank single:19197::"Good reproducible efforts.","It was hard to get consistent efforts and there is a question as to whether this reflects a maximal maneuver.","Poor effort, data can not be interpreted.","Variable effort-results affected","effort okay for first attempt at spirometry.","Results not reproducible due to ***"} FVC: ***L (pre), ***L  (post) FEV1: ***L, ***% predicted (pre), ***L, ***% predicted (post) FEV1/FVC ratio: *** (pre), *** (post) Interpretation: {Blank single:19197::"Spirometry consistent with mild obstructive disease","Spirometry consistent with moderate obstructive disease","Spirometry consistent with severe obstructive disease","Spirometry consistent with possible restrictive disease","Spirometry consistent with mixed obstructive and restrictive disease","Spirometry uninterpretable due to technique","Spirometry consistent with normal pattern","No overt abnormalities noted given today's efforts","Nonobstructive ratio, low FEV1","Nonobstructive ratio, low FEV1, possible restriction"}.  Please see scanned spirometry results for details.  Skin Testing: {Blank single:19197::"Select foods","Environmental allergy panel","Environmental  allergy panel and select foods","Food allergy panel","None","Deferred due to recent antihistamines use","deferred due to recent reaction","Pediatric Environmental Allergy Panel","Pediatric Food Panel","Select foods and environmental allergies"}. {Blank single:19197::"Adequate positive and negative controls","Inadequate positive control-testing invalid","Adequate positive and negative controls, dermatographism present, testing difficult to interpret"}. Results discussed with patient/family.   {  Blank single:19197::"Allergy testing results were read and interpreted by myself, documented by clinical staff.","Allergy testing results were read by ***,FNP, documented by clinical staff"}  Labs:  Lab Orders  No laboratory test(s) ordered today     Assessment and Plan  Assessment and Plan               {Blank single:19197::"This note in its entirety was forwarded to the Provider who requested this consultation."}  Other: {Blank multiple:19196:a:"***","samples provided of: ***","school forms provided","reviewed spirometry technique","reviewed inhaler technique"}  Thank you for your kind referral. I appreciate the opportunity to take part in Makala's care. Please do not hesitate to contact me with questions.***  Sincerely,  Tonny Bollman, MD [facility]

## 2023-04-27 ENCOUNTER — Ambulatory Visit: Payer: Medicare Other | Admitting: Internal Medicine

## 2023-05-25 ENCOUNTER — Other Ambulatory Visit: Payer: Self-pay | Admitting: Cardiology

## 2023-05-25 DIAGNOSIS — I10 Essential (primary) hypertension: Secondary | ICD-10-CM

## 2023-05-25 DIAGNOSIS — R0602 Shortness of breath: Secondary | ICD-10-CM

## 2023-06-26 ENCOUNTER — Other Ambulatory Visit: Payer: Self-pay | Admitting: Cardiology

## 2023-06-26 DIAGNOSIS — R0602 Shortness of breath: Secondary | ICD-10-CM

## 2023-06-26 DIAGNOSIS — I10 Essential (primary) hypertension: Secondary | ICD-10-CM

## 2023-08-11 ENCOUNTER — Other Ambulatory Visit: Payer: Self-pay | Admitting: Cardiology

## 2023-08-11 DIAGNOSIS — R0602 Shortness of breath: Secondary | ICD-10-CM

## 2023-08-11 DIAGNOSIS — I10 Essential (primary) hypertension: Secondary | ICD-10-CM

## 2023-09-24 ENCOUNTER — Other Ambulatory Visit: Payer: Medicare Other

## 2024-01-21 ENCOUNTER — Other Ambulatory Visit: Payer: Self-pay | Admitting: Cardiology

## 2024-01-21 DIAGNOSIS — R0602 Shortness of breath: Secondary | ICD-10-CM

## 2024-01-21 DIAGNOSIS — I10 Essential (primary) hypertension: Secondary | ICD-10-CM

## 2024-01-22 NOTE — Telephone Encounter (Signed)
 Pt of Dr. Michele. Passed her 3rd attempt. Does Dr. Michele want to refill? Please advise.

## 2024-02-19 ENCOUNTER — Other Ambulatory Visit: Payer: Self-pay | Admitting: Cardiology

## 2024-02-19 DIAGNOSIS — I10 Essential (primary) hypertension: Secondary | ICD-10-CM

## 2024-02-19 DIAGNOSIS — R0602 Shortness of breath: Secondary | ICD-10-CM
